# Patient Record
Sex: Male | Born: 1965 | Race: White | Hispanic: No | Marital: Married | Smoking: Never smoker
Health system: Southern US, Community
[De-identification: ages and names within clinical notes are randomized; demographics above are authoritative.]

## PROBLEM LIST (undated history)

## (undated) DIAGNOSIS — I2510 Atherosclerotic heart disease of native coronary artery without angina pectoris: Secondary | ICD-10-CM

## (undated) DIAGNOSIS — E7800 Pure hypercholesterolemia, unspecified: Secondary | ICD-10-CM

## (undated) DIAGNOSIS — I219 Acute myocardial infarction, unspecified: Secondary | ICD-10-CM

## (undated) DIAGNOSIS — K219 Gastro-esophageal reflux disease without esophagitis: Secondary | ICD-10-CM

---

## 2011-01-04 ENCOUNTER — Other Ambulatory Visit: Payer: Self-pay | Admitting: Sports Medicine

## 2011-01-04 ENCOUNTER — Ambulatory Visit
Admission: RE | Admit: 2011-01-04 | Discharge: 2011-01-04 | Disposition: A | Discharge status: Home / Self Care | Payer: Managed Care, Other (non HMO) | Source: Ambulatory Visit | Attending: Sports Medicine | Admitting: Sports Medicine

## 2011-01-04 DIAGNOSIS — M25511 Pain in right shoulder: Secondary | ICD-10-CM

## 2011-01-08 ENCOUNTER — Ambulatory Visit: Payer: Managed Care, Other (non HMO) | Admitting: Physical Therapy

## 2014-10-08 DIAGNOSIS — E7800 Pure hypercholesterolemia, unspecified: Secondary | ICD-10-CM

## 2014-10-08 HISTORY — DX: Pure hypercholesterolemia, unspecified: E78.00

## 2015-11-01 ENCOUNTER — Emergency Department (HOSPITAL_COMMUNITY): Payer: Self-pay

## 2015-11-01 ENCOUNTER — Encounter (HOSPITAL_COMMUNITY): Payer: Self-pay | Admitting: Emergency Medicine

## 2015-11-01 ENCOUNTER — Inpatient Hospital Stay (HOSPITAL_COMMUNITY)
Admission: EM | Admit: 2015-11-01 | Discharge: 2015-11-03 | DRG: 247 | Disposition: A | Discharge status: Home / Self Care | Payer: Self-pay | Attending: Internal Medicine | Admitting: Internal Medicine

## 2015-11-01 DIAGNOSIS — E785 Hyperlipidemia, unspecified: Secondary | ICD-10-CM

## 2015-11-01 DIAGNOSIS — R079 Chest pain, unspecified: Secondary | ICD-10-CM | POA: Diagnosis present

## 2015-11-01 DIAGNOSIS — I214 Non-ST elevation (NSTEMI) myocardial infarction: Principal | ICD-10-CM | POA: Diagnosis present

## 2015-11-01 DIAGNOSIS — Z7982 Long term (current) use of aspirin: Secondary | ICD-10-CM

## 2015-11-01 DIAGNOSIS — Z955 Presence of coronary angioplasty implant and graft: Secondary | ICD-10-CM

## 2015-11-01 DIAGNOSIS — I252 Old myocardial infarction: Secondary | ICD-10-CM

## 2015-11-01 DIAGNOSIS — I200 Unstable angina: Secondary | ICD-10-CM

## 2015-11-01 DIAGNOSIS — I25110 Atherosclerotic heart disease of native coronary artery with unstable angina pectoris: Secondary | ICD-10-CM | POA: Diagnosis present

## 2015-11-01 DIAGNOSIS — I219 Acute myocardial infarction, unspecified: Secondary | ICD-10-CM

## 2015-11-01 DIAGNOSIS — Z8249 Family history of ischemic heart disease and other diseases of the circulatory system: Secondary | ICD-10-CM

## 2015-11-01 HISTORY — DX: Acute myocardial infarction, unspecified: I21.9

## 2015-11-01 HISTORY — DX: Gastro-esophageal reflux disease without esophagitis: K21.9

## 2015-11-01 HISTORY — DX: Pure hypercholesterolemia, unspecified: E78.00

## 2015-11-01 HISTORY — DX: Atherosclerotic heart disease of native coronary artery without angina pectoris: I25.10

## 2015-11-01 LAB — CBC
HEMATOCRIT: 40.6 % (ref 39.0–52.0)
HEMOGLOBIN: 13.9 g/dL (ref 13.0–17.0)
MCH: 29.6 pg (ref 26.0–34.0)
MCHC: 34.2 g/dL (ref 30.0–36.0)
MCV: 86.4 fL (ref 78.0–100.0)
Platelets: 249 10*3/uL (ref 150–400)
RBC: 4.7 MIL/uL (ref 4.22–5.81)
RDW: 13.5 % (ref 11.5–15.5)
WBC: 15.1 10*3/uL — AB (ref 4.0–10.5)

## 2015-11-01 LAB — BASIC METABOLIC PANEL
ANION GAP: 10 (ref 5–15)
BUN: 13 mg/dL (ref 6–20)
CO2: 24 mmol/L (ref 22–32)
Calcium: 9.7 mg/dL (ref 8.9–10.3)
Chloride: 105 mmol/L (ref 101–111)
Creatinine, Ser: 1.41 mg/dL — ABNORMAL HIGH (ref 0.61–1.24)
GFR calc Af Amer: >60 mL/min (ref >60)
GFR, EST NON AFRICAN AMERICAN: 58 mL/min — AB (ref >60)
GLUCOSE: 124 mg/dL — AB (ref 65–99)
POTASSIUM: 4 mmol/L (ref 3.5–5.1)
Sodium: 139 mmol/L (ref 135–145)

## 2015-11-01 LAB — I-STAT TROPONIN, ED: Troponin i, poc: 0.06 ng/mL (ref 0.00–0.08)

## 2015-11-01 LAB — D-DIMER, QUANTITATIVE (NOT AT ARMC)

## 2015-11-01 MED ORDER — IOPAMIDOL (ISOVUE-370) INJECTION 76%
INTRAVENOUS | Status: AC
  Administered 2015-11-02: 100 mL
  Filled 2015-11-01: qty 100

## 2015-11-01 MED ORDER — HEPARIN BOLUS VIA INFUSION
4000.0000 [IU] | Freq: Once | INTRAVENOUS | Status: AC
  Administered 2015-11-01: 4000 [IU] via INTRAVENOUS
  Filled 2015-11-01: qty 4000

## 2015-11-01 MED ORDER — HEPARIN (PORCINE) IN NACL 100-0.45 UNIT/ML-% IJ SOLN
750.0000 [IU]/h | INTRAMUSCULAR | Status: DC
  Administered 2015-11-01: 850 [IU]/h via INTRAVENOUS
  Filled 2015-11-01: qty 250

## 2015-11-01 NOTE — ED Provider Notes (Signed)
[facility]-EMERGENCY DEPT Provider Note   CSN: [id number] Arrival date & time: 11/01/15  2131  First Provider Contact:  First MD Initiated Contact with Patient 11/01/15 2148        History   Chief Complaint Chief Complaint  Patient presents with  . Chest Pain    HPI Lance Boyer is a 50 y.o. male.  9 patient presents to the ED with central chest pain that onset after he put this patient in a strenuous mountain bike ride. He states he had no pain while he was riding his bike. He developed central chest pain lightheadedness car on the way home. Pain was in the center of his chest ray into both shoulders associated with nausea and diaphoresis and shortness of breath. He felt somewhat improved with air conditioning in his car and pulled over to the side of the road. He drove himself home but continued to have pain with numbness and tingling in both arms. His wife then called EMS. Initial EKG from EMS showed diffuse ST depressions elevation aVR. He is given aspirin and nitroglycerin with relief and is now currently pain-free. He has never had this pain before. No cardiac history. Denies any chronic medication use. Used to have high cholesterol. States he has never had pain with riding his bike before.   The history is provided by the patient and the EMS personnel. The history is limited by the condition of the patient.  Chest Pain   Associated symptoms include nausea and shortness of breath. Pertinent negatives include no abdominal pain, no dizziness, no headaches, no palpitations, no vomiting and no weakness.    Past Medical History:  Diagnosis Date  . High cholesterol 10/08/2014    Patient Active Problem List   Diagnosis Date Noted  . Unstable angina (HCC) 11/02/2015    History reviewed. No pertinent surgical history.     Home Medications    Prior to Admission medications   Not on File    Family History History reviewed. No pertinent family history.  Social History Social  History  Substance Use Topics  . Smoking status: Never Smoker  . Smokeless tobacco: Never Used  . Alcohol use No     Allergies   Review of patient's allergies indicates no known allergies.   Review of Systems Review of Systems  HENT: Negative for congestion and rhinorrhea.   Eyes: Negative for visual disturbance.  Respiratory: Positive for chest tightness and shortness of breath.   Cardiovascular: Positive for chest pain. Negative for palpitations and leg swelling.  Gastrointestinal: Positive for nausea. Negative for abdominal pain and vomiting.  Genitourinary: Negative for dysuria, hematuria and urgency.  Musculoskeletal: Negative for arthralgias and myalgias.  Neurological: Negative for dizziness, weakness, light-headedness and headaches.   A complete 10 system review of systems was obtained and all systems are negative except as noted in the HPI and PMH.    Physical Exam Updated Vital Signs BP 120/72 (BP Location: Left Arm)   Pulse 66   Temp 98.1 F (36.7 C) (Oral)   Resp 16   Ht 5\' 10"  (1.778 m)   Wt 160 lb 3.2 oz (72.7 kg)   SpO2 100%   BMI 22.99 kg/m   Physical Exam  Constitutional: He is oriented to person, place, and time. He appears well-developed and well-nourished. No distress.  HENT:  Head: Normocephalic and atraumatic.  Mouth/Throat: Oropharynx is clear and moist. No oropharyngeal exudate.  Eyes: Conjunctivae and EOM are normal. Pupils are equal, round, and reactive to light.  Neck: Normal range of motion. Neck supple.  No meningismus.  Cardiovascular: Normal rate, regular rhythm, normal heart sounds and intact distal pulses.   No murmur heard. Pulmonary/Chest: Effort normal and breath sounds normal. No respiratory distress.  Abdominal: Soft. There is no tenderness. There is no rebound and no guarding.  Musculoskeletal: Normal range of motion. He exhibits no edema or tenderness.  Neurological: He is alert and oriented to person, place, and time. No  cranial nerve deficit. He exhibits normal muscle tone. Coordination normal.  No ataxia on finger to nose bilaterally. No pronator drift. 5/5 strength throughout. CN 2-12 intact.Equal grip strength. Sensation intact.   Skin: Skin is warm.  Psychiatric: He has a normal mood and affect. His behavior is normal.  Nursing note and vitals reviewed.    ED Treatments / Results  Labs (all labs ordered are listed, but only abnormal results are displayed) Labs Reviewed  BASIC METABOLIC PANEL - Abnormal; Notable for the following:       Result Value   Glucose, Bld 124 (*)    Creatinine, Ser 1.41 (*)    GFR calc non Af Amer 58 (*)    All other components within normal limits  CBC - Abnormal; Notable for the following:    WBC 15.1 (*)    All other components within normal limits  D-DIMER, QUANTITATIVE (NOT AT [facility])  HEPARIN LEVEL (UNFRACTIONATED)  HEPARIN LEVEL (UNFRACTIONATED)  CBC  MAGNESIUM  TROPONIN I  TROPONIN I  TROPONIN I  BRAIN NATRIURETIC PEPTIDE  LIPID PANEL  HEMOGLOBIN A1C  I-STAT TROPOININ, ED    EKG  EKG Interpretation  Date/Time:  Tuesday November 01 2015 23:53:47 EDT Ventricular Rate:  65 PR Interval:    QRS Duration: 98 QT Interval:  437 QTC Calculation: 455 R Axis:   89 Text Interpretation:  Sinus rhythm Left ventricular hypertrophy No significant change was found Confirmed by Manus Gunning  MD, Tniyah Nakagawa [phone]) on 11/01/2015 11:56:54 PM       Radiology Dg Chest 2 View  Result Date: 11/01/2015 CLINICAL DATA:  Chest pain and shortness of breath abnormality light ride this evening. EXAM: CHEST  2 VIEW COMPARISON:  None. FINDINGS: Normal cardiac silhouette and mediastinal contours. There is an ill-defined slightly wedge-shaped opacity within the peripheral aspect of the left mid lung. No pleural effusion or pneumothorax. No evidence of edema. No acute osseus abnormalities. IMPRESSION: Ill-defined wedge-shaped opacity with the peripheral aspect the left mid lung may represent  an area of early infection though note, pulmonary infarction in the setting of pulmonary embolism could have a similar appearance. Clinical correlation is advised. Further evaluation with chest CTA could be obtained as clinically indicated. Electronically Signed   By: Simonne Come M.D.   On: 11/01/2015 22:17  Ct Angio Chest Pe W/cm &/or Wo Cm  Result Date: 11/02/2015 CLINICAL DATA:  Abnormal chest chest radiograph. Acute onset of shortness of breath while mountain biking earlier today. EXAM: CT ANGIOGRAPHY CHEST WITH CONTRAST TECHNIQUE: Multidetector CT imaging of the chest was performed using the standard protocol during bolus administration of intravenous contrast. Multiplanar CT image reconstructions and MIPs were obtained to evaluate the vascular anatomy. CONTRAST:  100 cc Isovue 370 COMPARISON:  Chest radiograph-earlier same day FINDINGS: Vascular Findings: There is adequate opacification of the pulmonary arterial system with the main pulmonary artery measuring 422 Hounsfield units. There are no discrete filling defects within the pulmonary arterial tree to the level of the bilateral subsegmental pulmonary arteries. Evaluation of the distal subsegmental pulmonary arteries  is degraded secondary to patient respiratory artifact. Normal caliber of the main pulmonary artery. Normal heart size.  No pericardial effusion. Normal caliber of the thoracic aorta. No definite thoracic aortic dissection on this nongated examination. Bovine configuration of the aortic arch. The branch vessels of the aortic arch appear widely patent throughout their imaged course. 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 Review of the MIP images confirms the above findings. ---------------------------------------------------------------------------------- Nonvascular Findings: Mediastinum/Lymph Nodes: No bulky mediastinal, hilar axillary lymphadenopathy. Lungs/Pleura: Evaluation the pulmonary parenchyma is degraded secondary to patient  respiratory artifact. There is no CT correlate for questioned wedge-shaped opacity with the peripheral aspect of the left upper/mid lung. Minimal dependent subpleural ground-glass atelectasis. No discrete focal airspace opacities. No pleural effusion or pneumothorax. The central pulmonary airways appear widely patent. No discrete pulmonary nodules. Upper abdomen: Early arterial phase evaluation of the upper abdomen is normal. Musculoskeletal: No acute or aggressive osseous abnormalities. Regional soft tissues appear normal. Normal appearance of the thyroid gland. IMPRESSION: No acute cardiopulmonary disease. Specifically, no evidence of pulmonary embolism. The questioned wedge-shaped opacity within the peripheral aspect of the left upper/mid lung is without CT correlate and favored to be artifactual due to the confluence of overlying osseous and soft tissue structures. Electronically Signed   By: Simonne Come M.D.   On: 11/02/2015 00:28   Procedures Procedures (including critical care time)  Medications Ordered in ED Medications  heparin ADULT infusion 100 units/mL (25000 units/226mL sodium chloride 0.45%) (850 Units/hr Intravenous Transfusing/Transfer 11/02/15 0114)  aspirin EC tablet 81 mg (not administered)  nitroGLYCERIN (NITROSTAT) SL tablet 0.4 mg (not administered)  acetaminophen (TYLENOL) tablet 650 mg (not administered)  ondansetron (ZOFRAN) injection 4 mg (not administered)  clopidogrel (PLAVIX) tablet 300 mg (not administered)  metoprolol tartrate (LOPRESSOR) tablet 12.5 mg (not administered)  atorvastatin (LIPITOR) tablet 40 mg (not administered)  heparin bolus via infusion 4,000 Units (4,000 Units Intravenous Bolus from Bag 11/01/15 2230)  iopamidol (ISOVUE-370) 76 % injection (100 mLs  Contrast Given 11/02/15 0004)     Initial Impression / Assessment and Plan / ED Course  I have reviewed the triage vital signs and the nursing notes.  Pertinent labs & imaging results that were  available during my care of the patient were reviewed by me and considered in my medical decision making (see chart for details).  Clinical Course   Presentation highly concerning for angina. Patient chest pain-free now after aspirin or nitroglycerin. Initial EMS EKG showed ST depressions inferiorly with elevation aVR with dewinter T waves. Current EKG shows J-point elevation in inferior laterally with no comparison.  Discussed with Dr. Tresa Endo. He is pain-free at this time. EKG does not meet ST elevation MI criteria. Advises to start heparin and have cardiology fellow consult given EKG abnormalities for EMS.  D-dimer negative but wedge opacity on CXR.  No tachycardia or hypoxia.  Troponin 0.06.  No further chest pain in the ED. D/w Dr. Tarri Glenn who will admit patient for unstable angina, does request CTPE given CXR findings.   CRITICAL CARE Performed by: Glynn Octave Total critical care time: 40 minutes Critical care time was exclusive of separately billable procedures and treating other patients. Critical care was necessary to treat or prevent imminent or life-threatening deterioration. Critical care was time spent personally by me on the following activities: development of treatment plan with patient and/or surrogate as well as nursing, discussions with consultants, evaluation of patient's response to treatment, examination of patient, obtaining history from patient or surrogate, ordering  and performing treatments and interventions, ordering and review of laboratory studies, ordering and review of radiographic studies, pulse oximetry and re-evaluation of patient's condition.    Final Clinical Impressions(s) / ED Diagnoses   Final diagnoses:  Unstable angina (HCC)    New Prescriptions There are no discharge medications for this patient.    Glynn Octave, MD 11/02/15 [phone]

## 2015-11-01 NOTE — ED Notes (Signed)
Patient transported to CT 

## 2015-11-01 NOTE — ED Notes (Signed)
Patient transported to X-ray 

## 2015-11-01 NOTE — ED Triage Notes (Signed)
Pt arrived to ED via EMS. C/o central chest pain. Pt participated in exercise via bicycle. While driving pt felt pressure in chest, became diaphoretic and experienced SOB. Pain subside and returned once he arrived home. EMS arrived and administered 324 mg aspirin and 1 nitro. Pain initially reported at 6. EKG showed arrythmias with no elevations. No nausea or vomiting reported. No current chest pain.

## 2015-11-01 NOTE — ED Notes (Signed)
Pt back from x-ray in no apparent distress  

## 2015-11-01 NOTE — Progress Notes (Signed)
ANTICOAGULATION CONSULT NOTE - Initial Consult  Pharmacy Consult for heparin Indication: chest pain/ACS  Allergies not on file  Patient Measurements: Height: 5\' 10"  (177.8 cm) Weight: 160 lb (72.6 kg) IBW/kg (Calculated) : 73 Heparin Dosing Weight: 72 kg  Vital Signs: Temp: 97.7 F (36.5 C) (07/25 2134) Temp Source: Oral (07/25 2134) BP: 115/63 (07/25 2139) Pulse Rate: 64 (07/25 2139)  Labs:  Recent Labs  11/01/15 2152  HGB 13.9  HCT 40.6  PLT 249    CrCl cannot be calculated (No order found.).  Assessment: 50 yo m presenting with central CP - was biking and felt pressure, became diaphoretic and SOB - no meds PTA  PMH: HLD   AC: none pta - IV hep for r/o ACS  Renal: labs ip  Heme: labs ip  Goal of Therapy:  Heparin level 0.3-0.7 units/ml Monitor platelets by anticoagulation protocol: Yes   Plan:  Heparin bolus 4000 units Heparin infusion 850 units/hr Initial HL at 0400 Daily HL, CBC Monitor s/sx of bleeding  Isaac Bliss, PharmD, BCPS, [facility] Clinical Pharmacist Pager [phone] 11/01/2015 10:04 PM

## 2015-11-02 ENCOUNTER — Inpatient Hospital Stay (HOSPITAL_COMMUNITY): Payer: Self-pay

## 2015-11-02 ENCOUNTER — Encounter (HOSPITAL_COMMUNITY): Payer: Self-pay | Admitting: Radiology

## 2015-11-02 ENCOUNTER — Encounter (HOSPITAL_COMMUNITY)
Admission: EM | Disposition: A | Discharge status: Home / Self Care | Payer: Self-pay | Source: Home / Self Care | Attending: Internal Medicine

## 2015-11-02 DIAGNOSIS — R079 Chest pain, unspecified: Secondary | ICD-10-CM | POA: Diagnosis present

## 2015-11-02 DIAGNOSIS — I200 Unstable angina: Secondary | ICD-10-CM

## 2015-11-02 DIAGNOSIS — I2510 Atherosclerotic heart disease of native coronary artery without angina pectoris: Secondary | ICD-10-CM

## 2015-11-02 DIAGNOSIS — I252 Old myocardial infarction: Secondary | ICD-10-CM

## 2015-11-02 HISTORY — PX: CARDIAC CATHETERIZATION: SHX172

## 2015-11-02 LAB — CBC
HCT: 42 % (ref 39.0–52.0)
HEMATOCRIT: 38.9 % — AB (ref 39.0–52.0)
HEMOGLOBIN: 13.8 g/dL (ref 13.0–17.0)
Hemoglobin: 12.9 g/dL — ABNORMAL LOW (ref 13.0–17.0)
MCH: 28.4 pg (ref 26.0–34.0)
MCH: 28.5 pg (ref 26.0–34.0)
MCHC: 32.9 g/dL (ref 30.0–36.0)
MCHC: 33.2 g/dL (ref 30.0–36.0)
MCV: 86.4 fL (ref 78.0–100.0)
MCV: 86.1 fL (ref 78.0–100.0)
PLATELETS: 215 10*3/uL (ref 150–400)
Platelets: 262 10*3/uL (ref 150–400)
RBC: 4.86 MIL/uL (ref 4.22–5.81)
RBC: 4.52 MIL/uL (ref 4.22–5.81)
RDW: 13.6 % (ref 11.5–15.5)
RDW: 13.7 % (ref 11.5–15.5)
WBC: 12.4 10*3/uL — ABNORMAL HIGH (ref 4.0–10.5)
WBC: 8 10*3/uL (ref 4.0–10.5)

## 2015-11-02 LAB — ECHOCARDIOGRAM COMPLETE
HEIGHTINCHES: 70 in
Weight: 2563.2 oz

## 2015-11-02 LAB — LIPID PANEL
CHOLESTEROL: 256 mg/dL — AB (ref 0–200)
HDL: 74 mg/dL (ref >40)
LDL Cholesterol: 169 mg/dL — ABNORMAL HIGH (ref 0–99)
TRIGLYCERIDES: 64 mg/dL (ref <150)
Total CHOL/HDL Ratio: 3.5 RATIO
VLDL: 13 mg/dL (ref 0–40)

## 2015-11-02 LAB — GLUCOSE, CAPILLARY: GLUCOSE-CAPILLARY: 97 mg/dL (ref 65–99)

## 2015-11-02 LAB — HEPARIN LEVEL (UNFRACTIONATED)
HEPARIN UNFRACTIONATED: 0.45 [IU]/mL (ref 0.30–0.70)
HEPARIN UNFRACTIONATED: 0.4 [IU]/mL (ref 0.30–0.70)
Heparin Unfractionated: 0.79 IU/mL — ABNORMAL HIGH (ref 0.30–0.70)

## 2015-11-02 LAB — POCT ACTIVATED CLOTTING TIME: ACTIVATED CLOTTING TIME: 290 s

## 2015-11-02 LAB — CREATININE, SERUM
CREATININE: 1.03 mg/dL (ref 0.61–1.24)
GFR calc Af Amer: >60 mL/min (ref >60)

## 2015-11-02 LAB — TROPONIN I
TROPONIN I: 0.44 ng/mL — AB (ref <0.03)
TROPONIN I: 0.7 ng/mL — AB (ref <0.03)
TROPONIN I: 0.9 ng/mL — AB (ref <0.03)

## 2015-11-02 LAB — MAGNESIUM: Magnesium: 2.5 mg/dL — ABNORMAL HIGH (ref 1.7–2.4)

## 2015-11-02 LAB — BRAIN NATRIURETIC PEPTIDE: B NATRIURETIC PEPTIDE 5: 32.8 pg/mL (ref 0.0–100.0)

## 2015-11-02 IMAGING — CT CT ANGIO CHEST
1 of 9 series · 13 of 36 positions shown · IV contrast (Iohexol (Omnipaque 350))
Comparison: Chest radiograph-earlier same day

CLINICAL DATA: Abnormal chest chest radiograph. Acute onset of
shortness of breath while mountain biking earlier today.

EXAM:
CT ANGIOGRAPHY CHEST WITH CONTRAST
TECHNIQUE: Multidetector CT imaging of the chest was performed using the
standard protocol during bolus administration of intravenous
contrast. Multiplanar CT image reconstructions and MIPs were
obtained to evaluate the vascular anatomy.
CONTRAST:  100 cc Isovue 370

[Series 406: thins pacs · axial · 0.65mm/px · z∈[+75,+333]mm · 13 of 302 slices shown]
[im 22/302  lung]
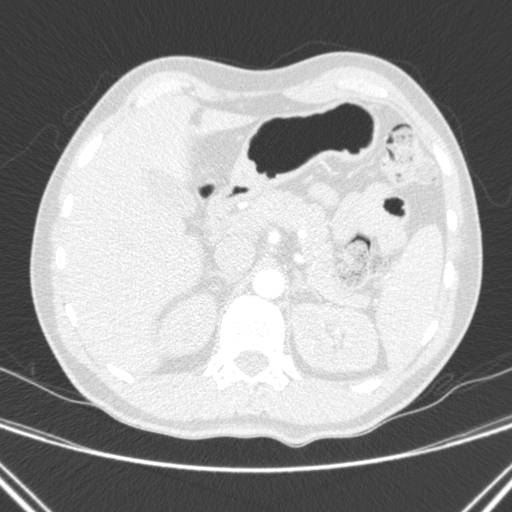
[im 44/302  mediastinal]
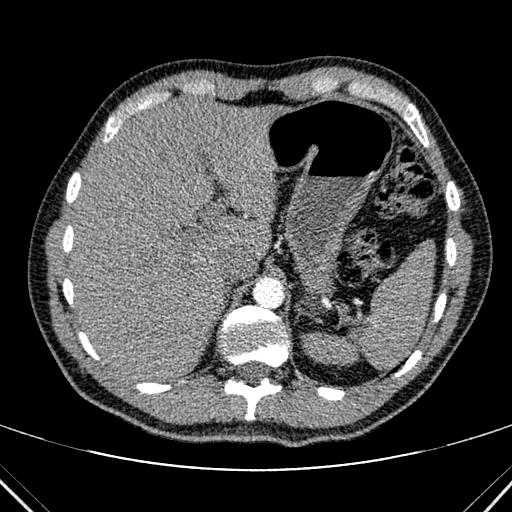
[im 65/302  lung]
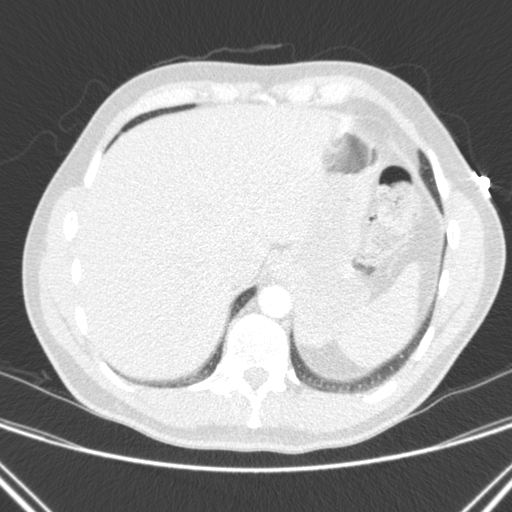
[im 87/302  mediastinal]
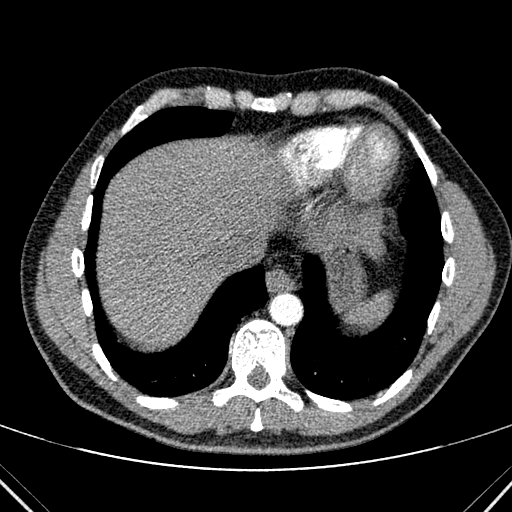
[im 108/302  lung]
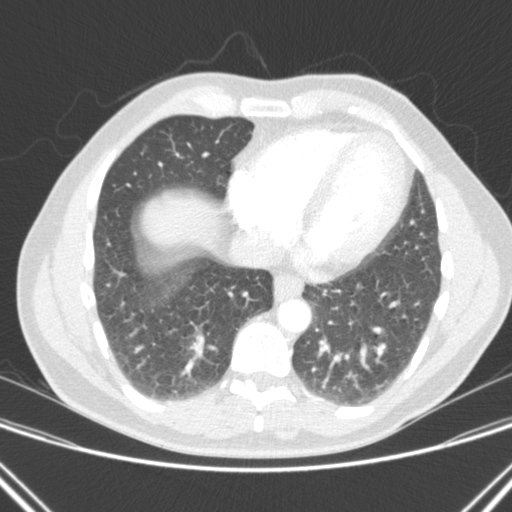
[im 130/302  mediastinal]
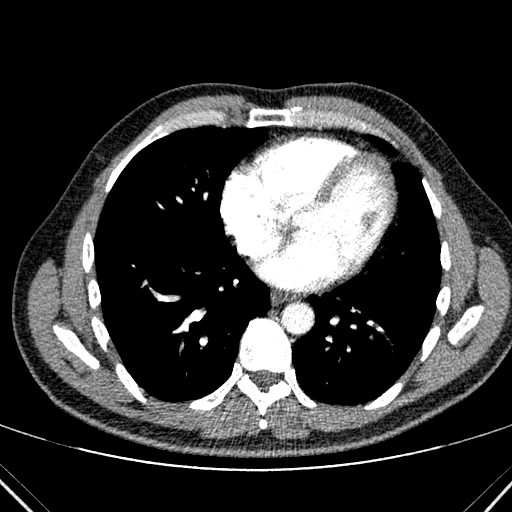
[im 151/302  lung]
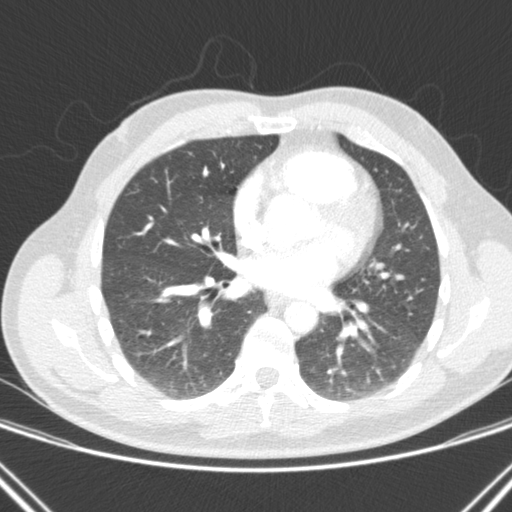
[im 173/302  mediastinal]
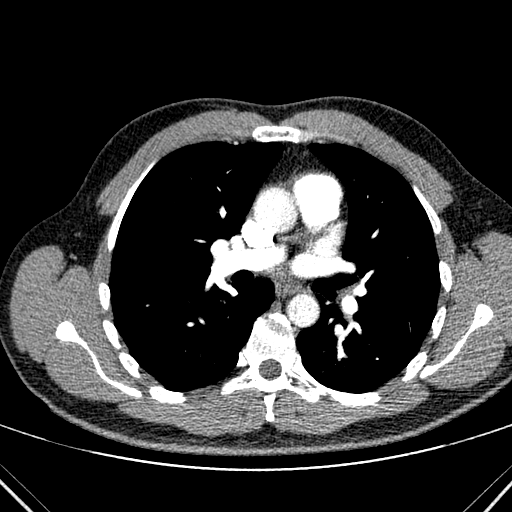
[im 194/302  lung]
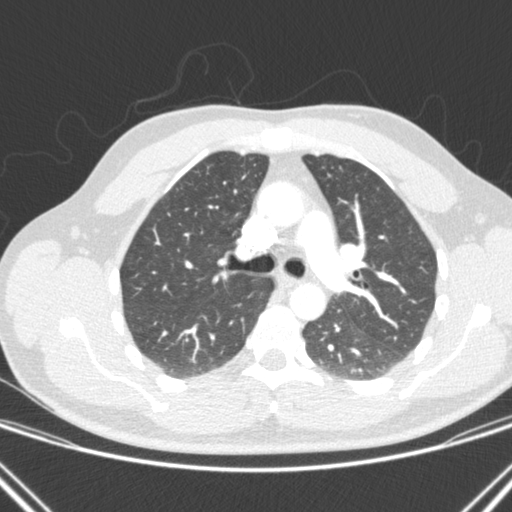
[im 216/302  mediastinal]
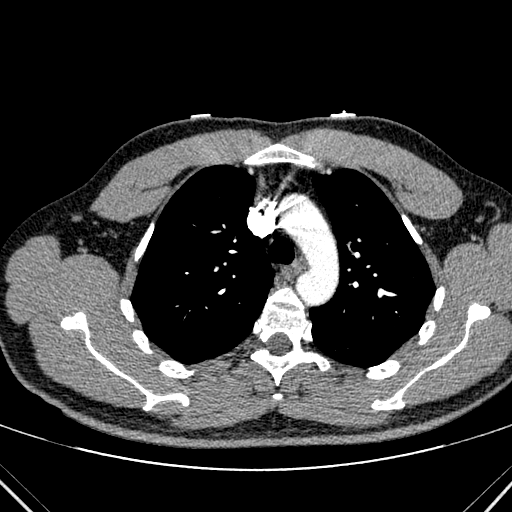
[im 237/302  lung]
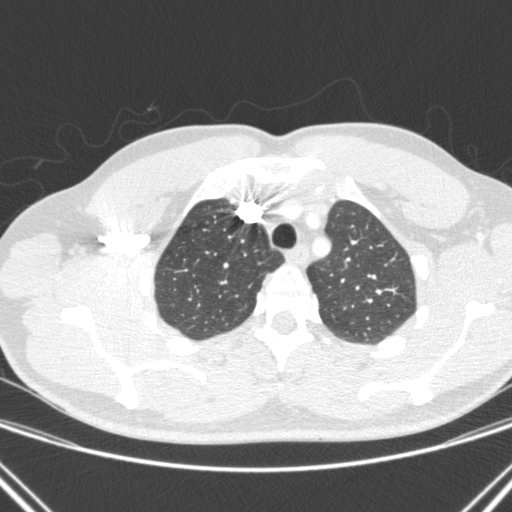
[im 259/302  mediastinal]
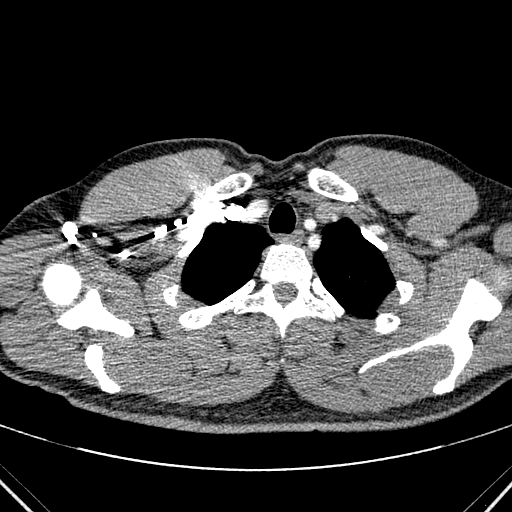
[im 280/302  lung]
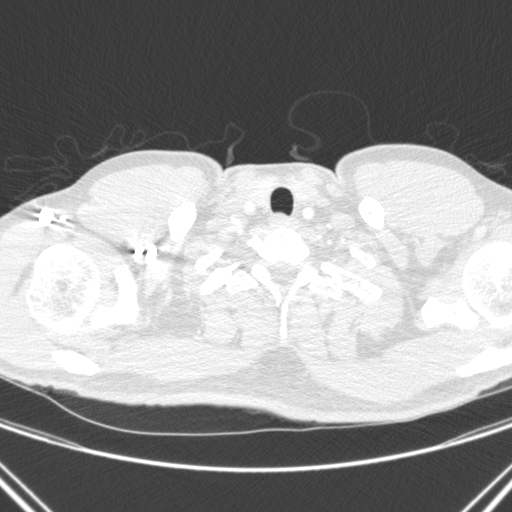

[13 of 36 positions shown; findings below may reference images not displayed]

FINDINGS: Vascular Findings:

There is adequate opacification of the pulmonary arterial system
with the main pulmonary artery measuring 422 Hounsfield units. There
are no discrete filling defects within the pulmonary arterial tree
to the level of the bilateral subsegmental pulmonary arteries.
Evaluation of the distal subsegmental pulmonary arteries is degraded
secondary to patient respiratory artifact. Normal caliber of the
main pulmonary artery.

Normal heart size.  No pericardial effusion.

Normal caliber of the thoracic aorta. No definite thoracic aortic
dissection on this nongated examination. Bovine configuration of the
aortic arch. The branch vessels of the aortic arch appear widely
patent throughout their imaged course. Note is made of a small
ductus diverticulum.

Review of the MIP images confirms the above findings.

----------------------------------------------------------------------------------

Nonvascular Findings:

Mediastinum/Lymph Nodes: No bulky mediastinal, hilar axillary
lymphadenopathy.

Lungs/Pleura: Evaluation the pulmonary parenchyma is degraded
secondary to patient respiratory artifact.

There is no CT correlate for questioned wedge-shaped opacity with
the peripheral aspect of the left upper/mid lung.

Minimal dependent subpleural ground-glass atelectasis. No discrete
focal airspace opacities. No pleural effusion or pneumothorax. The
central pulmonary airways appear widely patent. No discrete
pulmonary nodules.

Upper abdomen: Early arterial phase evaluation of the upper abdomen
is normal.

Musculoskeletal: No acute or aggressive osseous abnormalities.

Regional soft tissues appear normal. Normal appearance of the
thyroid gland.
IMPRESSION: No acute cardiopulmonary disease. Specifically, no evidence of
pulmonary embolism. The questioned wedge-shaped opacity within the
peripheral aspect of the left upper/mid lung is without CT correlate
and favored to be artifactual due to the confluence of overlying
osseous and soft tissue structures.

## 2015-11-02 SURGERY — LEFT HEART CATH AND CORONARY ANGIOGRAPHY

## 2015-11-02 MED ORDER — MIDAZOLAM HCL 2 MG/2ML IJ SOLN
INTRAMUSCULAR | Status: DC | PRN
  Administered 2015-11-02 (×2): 1 mg via INTRAVENOUS
  Administered 2015-11-02: 2 mg via INTRAVENOUS

## 2015-11-02 MED ORDER — ASPIRIN EC 81 MG PO TBEC
81.0000 mg | DELAYED_RELEASE_TABLET | Freq: Every day | ORAL | Status: DC
  Administered 2015-11-03: 81 mg via ORAL
  Filled 2015-11-02: qty 1

## 2015-11-02 MED ORDER — ASPIRIN 81 MG PO CHEW
81.0000 mg | CHEWABLE_TABLET | ORAL | Status: AC
  Administered 2015-11-02: 81 mg via ORAL
  Filled 2015-11-02: qty 1

## 2015-11-02 MED ORDER — TICAGRELOR 90 MG PO TABS: 90.0000 mg | ORAL_TABLET | Freq: Two times a day (BID) | ORAL | Status: DC

## 2015-11-02 MED ORDER — ATORVASTATIN CALCIUM 40 MG PO TABS
40.0000 mg | ORAL_TABLET | Freq: Every day | ORAL | Status: DC
  Administered 2015-11-02: 40 mg via ORAL
  Filled 2015-11-02: qty 1

## 2015-11-02 MED ORDER — SODIUM CHLORIDE 0.9 % IV SOLN: 250.0000 mL | INTRAVENOUS | Status: DC | PRN

## 2015-11-02 MED ORDER — TICAGRELOR 90 MG PO TABS
ORAL_TABLET | ORAL | Status: DC | PRN
  Administered 2015-11-02: 180 mg via ORAL

## 2015-11-02 MED ORDER — SODIUM CHLORIDE 0.9% FLUSH: 3.0000 mL | Freq: Two times a day (BID) | INTRAVENOUS | Status: DC

## 2015-11-02 MED ORDER — HEPARIN (PORCINE) IN NACL 2-0.9 UNIT/ML-% IJ SOLN
INTRAMUSCULAR | Status: AC
  Filled 2015-11-02: qty 1000

## 2015-11-02 MED ORDER — HEART ATTACK BOUNCING BOOK
Freq: Once | Status: AC
  Administered 2015-11-02: 22:00:00
  Filled 2015-11-02: qty 1

## 2015-11-02 MED ORDER — TICAGRELOR 90 MG PO TABS
ORAL_TABLET | ORAL | Status: AC
  Filled 2015-11-02: qty 2

## 2015-11-02 MED ORDER — SODIUM CHLORIDE 0.9 % WEIGHT BASED INFUSION
3.0000 mL/kg/h | INTRAVENOUS | Status: DC
Start: 2015-11-02 — End: 2015-11-02

## 2015-11-02 MED ORDER — LIDOCAINE HCL (PF) 1 % IJ SOLN
INTRAMUSCULAR | Status: AC
  Filled 2015-11-02: qty 30

## 2015-11-02 MED ORDER — LIDOCAINE HCL (PF) 1 % IJ SOLN
INTRAMUSCULAR | Status: DC | PRN
  Administered 2015-11-02: 3 mL via INTRADERMAL
  Administered 2015-11-02: 20 mL via INTRADERMAL

## 2015-11-02 MED ORDER — HEPARIN SODIUM (PORCINE) 1000 UNIT/ML IJ SOLN
INTRAMUSCULAR | Status: AC
  Filled 2015-11-02: qty 1

## 2015-11-02 MED ORDER — CLOPIDOGREL BISULFATE 75 MG PO TABS: 75.0000 mg | ORAL_TABLET | Freq: Every day | ORAL | Status: DC

## 2015-11-02 MED ORDER — METOPROLOL TARTRATE 12.5 MG HALF TABLET
12.5000 mg | ORAL_TABLET | Freq: Two times a day (BID) | ORAL | Status: DC
Start: 2015-11-02 — End: 2015-11-03
  Administered 2015-11-02 – 2015-11-03 (×3): 12.5 mg via ORAL
  Filled 2015-11-02 (×3): qty 1

## 2015-11-02 MED ORDER — CLOPIDOGREL BISULFATE 75 MG PO TABS
300.0000 mg | ORAL_TABLET | Freq: Once | ORAL | Status: AC
  Administered 2015-11-02: 300 mg via ORAL
  Filled 2015-11-02: qty 4

## 2015-11-02 MED ORDER — ENOXAPARIN SODIUM 40 MG/0.4ML ~~LOC~~ SOLN
40.0000 mg | SUBCUTANEOUS | Status: DC
  Administered 2015-11-03: 40 mg via SUBCUTANEOUS
  Filled 2015-11-02 (×2): qty 0.4

## 2015-11-02 MED ORDER — IOPAMIDOL (ISOVUE-370) INJECTION 76%
INTRAVENOUS | Status: AC
  Filled 2015-11-02: qty 50

## 2015-11-02 MED ORDER — ONDANSETRON HCL 4 MG/2ML IJ SOLN: 4.0000 mg | Freq: Four times a day (QID) | INTRAMUSCULAR | Status: DC | PRN

## 2015-11-02 MED ORDER — IOPAMIDOL (ISOVUE-370) INJECTION 76%
INTRAVENOUS | Status: AC
  Filled 2015-11-02: qty 100

## 2015-11-02 MED ORDER — VERAPAMIL HCL 2.5 MG/ML IV SOLN
INTRAVENOUS | Status: AC
  Filled 2015-11-02: qty 2

## 2015-11-02 MED ORDER — IOPAMIDOL (ISOVUE-370) INJECTION 76%
INTRAVENOUS | Status: DC | PRN
  Administered 2015-11-02: 130 mL via INTRA_ARTERIAL

## 2015-11-02 MED ORDER — BIVALIRUDIN BOLUS VIA INFUSION - CUPID
INTRAVENOUS | Status: DC | PRN
  Administered 2015-11-02: 54.525 mg via INTRAVENOUS

## 2015-11-02 MED ORDER — ASPIRIN 81 MG PO CHEW: 81.0000 mg | CHEWABLE_TABLET | Freq: Every day | ORAL | Status: DC

## 2015-11-02 MED ORDER — VERAPAMIL HCL 2.5 MG/ML IV SOLN
INTRAVENOUS | Status: DC | PRN
  Administered 2015-11-02: 10 mL via INTRA_ARTERIAL

## 2015-11-02 MED ORDER — FENTANYL CITRATE (PF) 100 MCG/2ML IJ SOLN
INTRAMUSCULAR | Status: DC | PRN
  Administered 2015-11-02 (×3): 25 ug via INTRAVENOUS

## 2015-11-02 MED ORDER — MIDAZOLAM HCL 2 MG/2ML IJ SOLN
INTRAMUSCULAR | Status: AC
  Filled 2015-11-02: qty 2

## 2015-11-02 MED ORDER — SODIUM CHLORIDE 0.9% FLUSH: 3.0000 mL | INTRAVENOUS | Status: DC | PRN

## 2015-11-02 MED ORDER — FENTANYL CITRATE (PF) 100 MCG/2ML IJ SOLN
INTRAMUSCULAR | Status: AC
  Filled 2015-11-02: qty 2

## 2015-11-02 MED ORDER — SODIUM CHLORIDE 0.9 % IV SOLN
INTRAVENOUS | Status: DC | PRN
  Administered 2015-11-02: 1.75 mg/kg/h via INTRAVENOUS

## 2015-11-02 MED ORDER — BIVALIRUDIN 250 MG IV SOLR
INTRAVENOUS | Status: AC
  Filled 2015-11-02: qty 250

## 2015-11-02 MED ORDER — ACETAMINOPHEN 325 MG PO TABS: 650.0000 mg | ORAL_TABLET | ORAL | Status: DC | PRN

## 2015-11-02 MED ORDER — NITROGLYCERIN 1 MG/10 ML FOR IR/CATH LAB
INTRA_ARTERIAL | Status: AC
  Filled 2015-11-02: qty 10

## 2015-11-02 MED ORDER — NITROGLYCERIN 0.4 MG SL SUBL: 0.4000 mg | SUBLINGUAL_TABLET | SUBLINGUAL | Status: DC | PRN

## 2015-11-02 MED ORDER — SODIUM CHLORIDE 0.9% FLUSH
3.0000 mL | Freq: Two times a day (BID) | INTRAVENOUS | Status: DC
  Administered 2015-11-02: 3 mL via INTRAVENOUS

## 2015-11-02 MED ORDER — ANGIOPLASTY BOOK
Freq: Once | Status: AC
  Administered 2015-11-02: 21:00:00
  Filled 2015-11-02: qty 1

## 2015-11-02 MED ORDER — SODIUM CHLORIDE 0.9 % IV SOLN
INTRAVENOUS | Status: AC
  Administered 2015-11-02 (×2): via INTRAVENOUS

## 2015-11-02 MED ORDER — HEPARIN (PORCINE) IN NACL 2-0.9 UNIT/ML-% IJ SOLN
INTRAMUSCULAR | Status: DC | PRN
  Administered 2015-11-02: 1000 mL

## 2015-11-02 MED ORDER — SODIUM CHLORIDE 0.9 % WEIGHT BASED INFUSION: 1.0000 mL/kg/h | INTRAVENOUS | Status: DC

## 2015-11-02 MED ORDER — NITROGLYCERIN 1 MG/10 ML FOR IR/CATH LAB
INTRA_ARTERIAL | Status: DC | PRN
  Administered 2015-11-02: 200 ug via INTRA_ARTERIAL

## 2015-11-02 MED ORDER — TICAGRELOR 90 MG PO TABS
90.0000 mg | ORAL_TABLET | Freq: Two times a day (BID) | ORAL | Status: DC
  Administered 2015-11-02: 90 mg via ORAL
  Filled 2015-11-02 (×2): qty 1

## 2015-11-02 SURGICAL SUPPLY — 24 items
BALLN ~~LOC~~ EUPHORA RX 3.5X20 (BALLOONS) ×3
BALLN ~~LOC~~ EUPHORA RX 3.75X20 (BALLOONS) ×3
BALLOON ~~LOC~~ EUPHORA RX 3.5X20 (BALLOONS) ×1 IMPLANT
BALLOON ~~LOC~~ EUPHORA RX 3.75X20 (BALLOONS) ×1 IMPLANT
CATH INFINITI 5 FR JL3.5 (CATHETERS) ×3 IMPLANT
CATH INFINITI 5FR ANG PIGTAIL (CATHETERS) IMPLANT
CATH INFINITI JR4 5F (CATHETERS) ×3 IMPLANT
DEVICE RAD COMP TR BAND LRG (VASCULAR PRODUCTS) ×3 IMPLANT
GLIDESHEATH SLEND SS 6F .021 (SHEATH) ×3 IMPLANT
GUIDE CATH RUNWAY 6FR CLS3 (CATHETERS) ×3 IMPLANT
GUIDE CATH RUNWAY 6FR FR4 (CATHETERS) ×3 IMPLANT
KIT ENCORE 26 ADVANTAGE (KITS) ×3 IMPLANT
KIT HEART LEFT (KITS) ×3 IMPLANT
PACK CARDIAC CATHETERIZATION (CUSTOM PROCEDURE TRAY) ×3 IMPLANT
SHEATH PINNACLE 5F 10CM (SHEATH) ×3 IMPLANT
SHEATH PINNACLE 6F 10CM (SHEATH) ×3 IMPLANT
STENT SYNERGY DES 3.5X24 (Permanent Stent) ×3 IMPLANT
STENT SYNERGY DES 3X28 (Permanent Stent) ×3 IMPLANT
TRANSDUCER W/STOPCOCK (MISCELLANEOUS) ×3 IMPLANT
TUBING CIL FLEX 10 FLL-RA (TUBING) ×3 IMPLANT
VALVE GUARDIAN II ~~LOC~~ HEMO (MISCELLANEOUS) ×3 IMPLANT
WIRE ASAHI PROWATER 180CM (WIRE) ×3 IMPLANT
WIRE HI TORQ VERSACORE-J 145CM (WIRE) ×3 IMPLANT
WIRE SAFE-T 1.5MM-J .035X260CM (WIRE) ×3 IMPLANT

## 2015-11-02 NOTE — Progress Notes (Signed)
  Echocardiogram 2D Echocardiogram has been performed.  Lance Boyer 11/02/2015, 3:53 PM

## 2015-11-02 NOTE — ED Notes (Signed)
Pt back from CT in no apparent distress  

## 2015-11-02 NOTE — Progress Notes (Signed)
CRITICAL VALUE ALERT  Critical value received: Troponin 0.44  Date of notification:  11/02/2015  Time of notification:  0234  Critical value read back:Yes.    Nurse who received alert:  Langley Gauss  MD notified (1st page):  Azeem  Time of first page:  0236  MD notified (2nd page):Azeem  Time of second page:0254  Responding MD:  Azeem  Time MD responded:  0256  No new orders

## 2015-11-02 NOTE — Progress Notes (Addendum)
Site area: right groin  Site Prior to Removal:  Level 0  Pressure Applied For 20 MINUTES    Minutes Beginning at 1540  Manual:   Yes.    Patient Status During Pull:  Stable   Post Pull Groin Site:  Level 0  Post Pull Instructions Given:  Yes.    Post Pull Pulses Present:  Yes.    Dressing Applied:  Yes.    Comments:  Rechecked site with no change in assessment noted

## 2015-11-02 NOTE — H&P (View-Only) (Signed)
Patient Name: Lance Boyer Date of Encounter: 11/02/2015  Hospital Problem List     Active Problems:   Unstable angina (HCC)    Subjective   No angina at this time. Resting comfortably.   Inpatient Medications    . aspirin EC  81 mg Oral Daily  . atorvastatin  40 mg Oral q1800  . metoprolol tartrate  12.5 mg Oral BID    Vital Signs    Vitals:   11/02/15 0100 11/02/15 0115 11/02/15 0132 11/02/15 0440  BP: 125/81  120/72 115/64  Pulse: 65  66 63  Resp: Temp:  98.3 F (36.8 C) 98.1 F (36.7 C) 97.9 F (36.6 C)  TempSrc:  Oral Oral Oral  SpO2: 100%  100% 99%  Weight:   160 lb 3.2 oz (72.7 kg)   Height:    (1.778 m)    No intake or output data in the 24 hours ending 11/02/15 0709 Filed Weights   11/01/15 2145 11/02/15 0132  Weight: 160 lb (72.6 kg) 160 lb 3.2 oz (72.7 kg)    Physical Exam    General: Pleasant, NAD. Neuro: Alert and oriented X 3. Moves all extremities spontaneously. Psych: Normal affect. HEENT:  Normal  Neck: Supple without bruits or JVD. Lungs:  Resp regular and unlabored, CTA. Heart: RRR no s3, s4, or murmurs. Abdomen: Soft, non-tender, non-distended, BS + x 4.  Extremities: No clubbing, cyanosis or edema. DP/PT/Radials 2+ and equal bilaterally.  Labs    CBC  Recent Labs  11/01/15 2152 11/02/15 0145  WBC 15.1* 12.4*  HGB 13.9 13.8  HCT 40.6 42.0  MCV 86.4 86.4  PLT 249 262   Basic Metabolic Panel  Recent Labs  11/01/15 2152 11/02/15 0145  NA 139  --   K 4.0  --   CL 105  --   CO2 24  --   GLUCOSE 124*  --   BUN 13  --   CREATININE 1.41*  --   CALCIUM 9.7  --   MG  --  2.5*   Liver Function Tests No results for input(s): AST, ALT, ALKPHOS, BILITOT, PROT, ALBUMIN in the last 72 hours. No results for input(s): LIPASE, AMYLASE in the last 72 hours. Cardiac Enzymes  Recent Labs  11/02/15 0145  TROPONINI 0.44*   BNP Invalid input(s): POCBNP D-Dimer  Recent Labs  11/01/15 2152  DDIMER <0.27     Hemoglobin A1C No results for input(s): HGBA1C in the last 72 hours. Fasting Lipid Panel  Recent Labs  11/02/15 0429  CHOL 256*  HDL 74  LDLCALC 169*  TRIG 64  CHOLHDL 3.5   Thyroid Function Tests No results for input(s): TSH, T4TOTAL, T3FREE, THYROIDAB in the last 72 hours.  Invalid input(s): FREET3  Telemetry    SR with PVCs  ECG    SR with diffuse ST changes  Radiology    Dg Chest 2 View  Result Date: 11/01/2015 CLINICAL DATA:  Chest pain and shortness of breath abnormality light ride this evening. EXAM: CHEST  2 VIEW COMPARISON:  None. FINDINGS: Normal cardiac silhouette and mediastinal contours. There is an ill-defined slightly wedge-shaped opacity within the peripheral aspect of the left mid lung. No pleural effusion or pneumothorax. No evidence of edema. No acute osseus abnormalities. IMPRESSION: Ill-defined wedge-shaped opacity with the peripheral aspect the left mid lung may represent an area of early infection though note, pulmonary infarction in the setting of pulmonary embolism could have a similar appearance. Clinical  correlation is advised. Further evaluation with chest CTA could be obtained as clinically indicated. Electronically Signed   By: Simonne Come M.D.   On: 11/01/2015 22:17  Ct Angio Chest Pe W/cm &/or Wo Cm  Result Date: 11/02/2015 CLINICAL DATA:  Abnormal chest chest radiograph. Acute onset of shortness of breath while mountain biking earlier today. EXAM: CT ANGIOGRAPHY CHEST WITH CONTRAST TECHNIQUE: Multidetector CT imaging of the chest was performed using the standard protocol during bolus administration of intravenous contrast. Multiplanar CT image reconstructions and MIPs were obtained to evaluate the vascular anatomy. CONTRAST:  100 cc Isovue 370 COMPARISON:  Chest radiograph-earlier same day FINDINGS: Vascular Findings: There is adequate opacification of the pulmonary arterial system with the main pulmonary artery measuring 422 Hounsfield units.  There are no discrete filling defects within the pulmonary arterial tree to the level of the bilateral subsegmental pulmonary arteries. Evaluation of the distal subsegmental pulmonary arteries is degraded secondary to patient respiratory artifact. Normal caliber of the main pulmonary artery. Normal heart size.  No pericardial effusion. Normal caliber of the thoracic aorta. No definite thoracic aortic dissection on this nongated examination. Bovine configuration of the aortic arch. The branch vessels of the aortic arch appear widely patent throughout their imaged course. 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 Review of the MIP images confirms the above findings. ---------------------------------------------------------------------------------- Nonvascular Findings: Mediastinum/Lymph Nodes: No bulky mediastinal, hilar axillary lymphadenopathy. Lungs/Pleura: Evaluation the pulmonary parenchyma is degraded secondary to patient respiratory artifact. There is no CT correlate for questioned wedge-shaped opacity with the peripheral aspect of the left upper/mid lung. Minimal dependent subpleural ground-glass atelectasis. No discrete focal airspace opacities. No pleural effusion or pneumothorax. The central pulmonary airways appear widely patent. No discrete pulmonary nodules. Upper abdomen: Early arterial phase evaluation of the upper abdomen is normal. Musculoskeletal: No acute or aggressive osseous abnormalities. Regional soft tissues appear normal. Normal appearance of the thyroid gland. IMPRESSION: No acute cardiopulmonary disease. Specifically, no evidence of pulmonary embolism. The questioned wedge-shaped opacity within the peripheral aspect of the left upper/mid lung is without CT correlate and favored to be artifactual due to the confluence of overlying osseous and soft tissue structures. Electronically Signed   By: Simonne Come M.D.   On: 11/02/2015 00:28   Assessment & Plan    50 yo male with PMH of  hyperlipidemia who presented to the [facility] ED for evaluation of chest pain. Reports being very active and started biking about 1 year ago, and losing about 20lbs. Yesterday experienced midsternal chest pain with nausea, dyspnea, and diaphoresis while driving back home from biking.   1. Unstable Angina: EKG in the ED showed diffuse ST changes, with initial trop neg, but 2nd trop 0.44. Was started on IV heparin on admission. Plans for possible LHC today. Currently without angina and resting comfortably. Reports family hx of cardiac diease with his father having an MI at age 68 with subsequent CABG. Reports being on a statin in the past, but has not been taking.  -- Lipid shows elevated cholesterol, and LDL. Statin ordered. BB started on admission. Loaded with Plavix -- Cath orders placed, follow up post cath.  Signed, Laverda Page NP-C Pager [phone]  I have seen and examined the patient along with Laverda Page NP-C.  I have reviewed the chart, notes and new data.  I agree with NP's note.  Key new complaints: now asymptomatic Key examination changes: normal CV exam Key new findings / data: mild increase in cardiac troponin, no real  ECG changes  PLAN: Unstable angina, for cath today. Left Circumflex disease? This procedure has been fully reviewed with the patient and written informed consent has been obtained.   Thurmon Fair, MD, [facility] [phone] 11/02/2015, 8:12 AM

## 2015-11-02 NOTE — H&P (Signed)
CC: CP  HPI:  50 yo [place] man, fairly active, history of DLD (took Simvastatin for couple years; stopped 8 months ago), MI in father in his 1's, brought to the ER by EMS for evaluation. He reports that he has started biking about a year ago and lost almost 20 lbs. Today he biked for over 4 miles without problems. However, afterwards, he started to midsternal chest pressure associated with nausea, SOB and diaphoresis what he described as "anxeity sweat" different than usual sweating during exercise. Symptoms persisted but still he chose to drive himself back home. He sat in his car but eventually notified his wife inside and decided to call 911 almost over 45 minutes of symptoms.  By the time EMS arrived, he already had started to feel a little better. He was given ASA and sl NTG with further improvement in his symptoms. Initial EKG from EMS showed diffuse ST, more pronounced in the lateral precordial leads. ECG in the ER showed sinus rhythm, J point elevation, no ST depression or ischemic  ST elevation. He is not CP free.   CXR in ER was reported as an abnormality in left lobe which was further evaluated by CTA chest negative for PE or any other acute abnormality.    Review of Systems:  10 systems reviewed unremarkable except as noted in HPI    Past Medical History:  Diagnosis Date  . High cholesterol 10/08/2014    No current facility-administered medications on file prior to encounter.    No current outpatient prescriptions on file prior to encounter.     No Known Allergies  Social History   Social History  . Marital status: Married    Spouse name: N/A  . Number of children: N/A  . Years of education: N/A   Occupational History  . Not on file.   Social History Main Topics  . Smoking status: Never Smoker  . Smokeless tobacco: Never Used  . Alcohol use No  . Drug use: No  . Sexual activity: Yes   Other Topics Concern  . Not on file   Social History Narrative  . No  narrative on file    History reviewed. No pertinent family history.  PHYSICAL EXAM: Vitals:   11/01/15 2300 11/01/15 2345  BP: 109/63 120/73  Pulse: 70 66  Resp: 16 13  Temp:     General:  Well appearing. No respiratory difficulty HEENT: normal Neck: supple. no JVD. Carotids 2+ bilat; no bruits. No lymphadenopathy or thryomegaly appreciated. Cor: PMI nondisplaced. Regular rate & rhythm. No rubs, gallops or murmurs. Lungs: clear Abdomen: soft, nontender, nondistended. No hepatosplenomegaly. No bruits or masses. Good bowel sounds. Extremities: no cyanosis, clubbing, rash, edema Neuro: alert & oriented x 3, cranial nerves grossly intact. moves all 4 extremities w/o difficulty. Affect pleasant.    Results for orders placed or performed during the hospital encounter of 11/01/15 (from the past 24 hour(s))  Basic metabolic panel     Status: Abnormal   Collection Time: 11/01/15  9:52 PM  Result Value Ref Range   Sodium 139 135 - 145 mmol/L   Potassium 4.0 3.5 - 5.1 mmol/L   Chloride 105 101 - 111 mmol/L   CO2 24 22 - 32 mmol/L   Glucose, Bld 124 (H) 65 - 99 mg/dL   BUN 13 6 - 20 mg/dL   Creatinine, Ser 5.78 (H) 0.61 - 1.24 mg/dL   Calcium 9.7 8.9 - 46.9 mg/dL   GFR calc non Af Denyse Dago  58 (L) >60 mL/min   GFR calc Af Amer >60 >60 mL/min   Anion gap 10 5 - 15  CBC     Status: Abnormal   Collection Time: 11/01/15  9:52 PM  Result Value Ref Range   WBC 15.1 (H) 4.0 - 10.5 K/uL   RBC 4.70 4.22 - 5.81 MIL/uL   Hemoglobin 13.9 13.0 - 17.0 g/dL   HCT 45.0 38.8 - 82.8 %   MCV 86.4 78.0 - 100.0 fL   MCH 29.6 26.0 - 34.0 pg   MCHC 34.2 30.0 - 36.0 g/dL   RDW 00.3 49.1 - 79.1 %   Platelets 249 150 - 400 K/uL  D-dimer, quantitative (not at [facility])     Status: None   Collection Time: 11/01/15  9:52 PM  Result Value Ref Range   D-Dimer, Quant <0.27 0.00 - 0.50 ug/mL-FEU  I-stat troponin, ED     Status: None   Collection Time: 11/01/15  9:57 PM  Result Value Ref Range   Troponin i, poc  0.06 0.00 - 0.08 ng/mL   Comment 3           Dg Chest 2 View  Result Date: 11/01/2015 CLINICAL DATA:  Chest pain and shortness of breath abnormality light ride this evening. EXAM: CHEST  2 VIEW COMPARISON:  None. FINDINGS: Normal cardiac silhouette and mediastinal contours. There is an ill-defined slightly wedge-shaped opacity within the peripheral aspect of the left mid lung. No pleural effusion or pneumothorax. No evidence of edema. No acute osseus abnormalities. IMPRESSION: Ill-defined wedge-shaped opacity with the peripheral aspect the left mid lung may represent an area of early infection though note, pulmonary infarction in the setting of pulmonary embolism could have a similar appearance. Clinical correlation is advised. Further evaluation with chest CTA could be obtained as clinically indicated. Electronically Signed   By: Simonne Come M.D.   On: 11/01/2015 22:17  Ct Angio Chest Pe W/cm &/or Wo Cm  Result Date: 11/02/2015 CLINICAL DATA:  Abnormal chest chest radiograph. Acute onset of shortness of breath while mountain biking earlier today. EXAM: CT ANGIOGRAPHY CHEST WITH CONTRAST TECHNIQUE: Multidetector CT imaging of the chest was performed using the standard protocol during bolus administration of intravenous contrast. Multiplanar CT image reconstructions and MIPs were obtained to evaluate the vascular anatomy. CONTRAST:  100 cc Isovue 370 COMPARISON:  Chest radiograph-earlier same day FINDINGS: Vascular Findings: There is adequate opacification of the pulmonary arterial system with the main pulmonary artery measuring 422 Hounsfield units. There are no discrete filling defects within the pulmonary arterial tree to the level of the bilateral subsegmental pulmonary arteries. Evaluation of the distal subsegmental pulmonary arteries is degraded secondary to patient respiratory artifact. Normal caliber of the main pulmonary artery. Normal heart size.  No pericardial effusion. Normal caliber of the  thoracic aorta. No definite thoracic aortic dissection on this nongated examination. Bovine configuration of the aortic arch. The branch vessels of the aortic arch appear widely patent throughout their imaged course. 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 Review of the MIP images confirms the above findings. ---------------------------------------------------------------------------------- Nonvascular Findings: Mediastinum/Lymph Nodes: No bulky mediastinal, hilar axillary lymphadenopathy. Lungs/Pleura: Evaluation the pulmonary parenchyma is degraded secondary to patient respiratory artifact. There is no CT correlate for questioned wedge-shaped opacity with the peripheral aspect of the left upper/mid lung. Minimal dependent subpleural ground-glass atelectasis. No discrete focal airspace opacities. No pleural effusion or pneumothorax. The central pulmonary airways appear widely patent. No discrete pulmonary nodules. Upper abdomen: Early arterial  phase evaluation of the upper abdomen is normal. Musculoskeletal: No acute or aggressive osseous abnormalities. Regional soft tissues appear normal. Normal appearance of the thyroid gland. IMPRESSION: No acute cardiopulmonary disease. Specifically, no evidence of pulmonary embolism. The questioned wedge-shaped opacity within the peripheral aspect of the left upper/mid lung is without CT correlate and favored to be artifactual due to the confluence of overlying osseous and soft tissue structures. Electronically Signed   By: Simonne Come M.D.   On: 11/02/2015 00:28    ASSESSMENT:  1. Unstable angina - his symptoms are concerning for ACS - at this time he CP has subsided; no evidence of acute HF or shock     PLAN/DISCUSSION:  Admit to cardiology  Will continue ASA 81 mg po qd, Heparin infusion, give Plavix 300 mg pox1 Start beta blocker and statin Check HbA1c, BNP, Lipids Serial Trop Plan for coronary angio in am Echo    Nevin Bloodgood, MD Cardiology

## 2015-11-02 NOTE — Research (Signed)
TWIL[facility] Research study presented to patient and wife. Patient states he would like to consent to enroll. Informed consent left for review. Questions encouraged and answered. Research will return in morning to consent if patient still desires.

## 2015-11-02 NOTE — Interval H&P Note (Signed)
Cath Lab Visit (complete for each Cath Lab visit)  Clinical Evaluation Leading to the Procedure:   ACS: Yes.    Non-ACS:    Anginal Classification: CCS IV  Anti-ischemic medical therapy: Minimal Therapy (1 class of medications)  Non-Invasive Test Results: No non-invasive testing performed  Prior CABG: No previous CABG      History and Physical Interval Note:  11/02/2015 11:26 AM  Lance Boyer  has presented today for surgery, with the diagnosis of cp  The various methods of treatment have been discussed with the patient and family. After consideration of risks, benefits and other options for treatment, the patient has consented to  Procedure(s): Left Heart Cath and Coronary Angiography (N/A) as a surgical intervention .  The patient's history has been reviewed, patient examined, no change in status, stable for surgery.  I have reviewed the patient's chart and labs.  Questions were answered to the patient's satisfaction.     Lance Muss

## 2015-11-02 NOTE — Progress Notes (Signed)
ANTICOAGULATION CONSULT NOTE - Follow Up Consult  Pharmacy Consult for Heparin Indication: chest pain/ACS  No Known Allergies  Patient Measurements: Height: 5\' 10"  (177.8 cm) Weight: 160 lb 3.2 oz (72.7 kg) IBW/kg (Calculated) : 73 Heparin Dosing Weight:  72.7 kg  Vital Signs: Temp: 97.9 F (36.6 C) (07/26 0440) Temp Source: Oral (07/26 0440) BP: 115/64 (07/26 0440) Pulse Rate: 63 (07/26 0440)  Labs:  Recent Labs  11/01/15 2152 11/02/15 0145 11/02/15 0429 11/02/15 0847  HGB 13.9 13.8  --   --   HCT 40.6 42.0  --   --   PLT 249 262  --   --   HEPARINUNFRC  --  0.79* 0.45 0.40  CREATININE 1.41*  --   --   --   TROPONINI  --  0.44*  --  0.70*    Estimated Creatinine Clearance: 65.9 mL/min (by C-G formula based on SCr of 1.41 mg/dL).  Assessment: 50 yo m presenting with central CP - was biking and felt pressure, became diaphoretic and SOB - no meds PTA. Enzymes slightly +.EKG in the ED showed diffuse ST changes  PMH: HLD   AC: IV hep for r/o ACS. HL 0.4 in goal. CBC WNL   Goal of Therapy:  Heparin level 0.3-0.7 units/ml Monitor platelets by anticoagulation protocol: Yes   Plan:  Loaded Plavix. Cath today. Continue heparin 750 units/hr  Sonal Dorwart S. Merilynn Finland, PharmD, BCPS Clinical Staff Pharmacist Pager [phone]   Misty Stanley Stillinger 11/02/2015,10:29 AM

## 2015-11-02 NOTE — Progress Notes (Signed)
Patient Name: Lance Boyer Date of Encounter: 11/02/2015  Hospital Problem List     Active Problems:   Unstable angina (HCC)    Subjective   No angina at this time. Resting comfortably.   Inpatient Medications    . aspirin EC  81 mg Oral Daily  . atorvastatin  40 mg Oral q1800  . metoprolol tartrate  12.5 mg Oral BID    Vital Signs    Vitals:   11/02/15 0100 11/02/15 0115 11/02/15 0132 11/02/15 0440  BP: 125/81  120/72 115/64  Pulse: 65  66 63  Resp: Temp:  98.3 F (36.8 C) 98.1 F (36.7 C) 97.9 F (36.6 C)  TempSrc:  Oral Oral Oral  SpO2: 100%  100% 99%  Weight:   160 lb 3.2 oz (72.7 kg)   Height:    (1.778 m)    No intake or output data in the 24 hours ending 11/02/15 0709 Filed Weights   11/01/15 2145 11/02/15 0132  Weight: 160 lb (72.6 kg) 160 lb 3.2 oz (72.7 kg)    Physical Exam    General: Pleasant, NAD. Neuro: Alert and oriented X 3. Moves all extremities spontaneously. Psych: Normal affect. HEENT:  Normal  Neck: Supple without bruits or JVD. Lungs:  Resp regular and unlabored, CTA. Heart: RRR no s3, s4, or murmurs. Abdomen: Soft, non-tender, non-distended, BS + x 4.  Extremities: No clubbing, cyanosis or edema. DP/PT/Radials 2+ and equal bilaterally.  Labs    CBC  Recent Labs  11/01/15 2152 11/02/15 0145  WBC 15.1* 12.4*  HGB 13.9 13.8  HCT 40.6 42.0  MCV 86.4 86.4  PLT 249 262   Basic Metabolic Panel  Recent Labs  11/01/15 2152 11/02/15 0145  NA 139  --   K 4.0  --   CL 105  --   CO2 24  --   GLUCOSE 124*  --   BUN 13  --   CREATININE 1.41*  --   CALCIUM 9.7  --   MG  --  2.5*   Liver Function Tests No results for input(s): AST, ALT, ALKPHOS, BILITOT, PROT, ALBUMIN in the last 72 hours. No results for input(s): LIPASE, AMYLASE in the last 72 hours. Cardiac Enzymes  Recent Labs  11/02/15 0145  TROPONINI 0.44*   BNP Invalid input(s): POCBNP D-Dimer  Recent Labs  11/01/15 2152  DDIMER <0.27     Hemoglobin A1C No results for input(s): HGBA1C in the last 72 hours. Fasting Lipid Panel  Recent Labs  11/02/15 0429  CHOL 256*  HDL 74  LDLCALC 169*  TRIG 64  CHOLHDL 3.5   Thyroid Function Tests No results for input(s): TSH, T4TOTAL, T3FREE, THYROIDAB in the last 72 hours.  Invalid input(s): FREET3  Telemetry    SR with PVCs  ECG    SR with diffuse ST changes  Radiology    Dg Chest 2 View  Result Date: 11/01/2015 CLINICAL DATA:  Chest pain and shortness of breath abnormality light ride this evening. EXAM: CHEST  2 VIEW COMPARISON:  None. FINDINGS: Normal cardiac silhouette and mediastinal contours. There is an ill-defined slightly wedge-shaped opacity within the peripheral aspect of the left mid lung. No pleural effusion or pneumothorax. No evidence of edema. No acute osseus abnormalities. IMPRESSION: Ill-defined wedge-shaped opacity with the peripheral aspect the left mid lung may represent an area of early infection though note, pulmonary infarction in the setting of pulmonary embolism could have a similar appearance. Clinical  correlation is advised. Further evaluation with chest CTA could be obtained as clinically indicated. Electronically Signed   By: Simonne Come M.D.   On: 11/01/2015 22:17  Ct Angio Chest Pe W/cm &/or Wo Cm  Result Date: 11/02/2015 CLINICAL DATA:  Abnormal chest chest radiograph. Acute onset of shortness of breath while mountain biking earlier today. EXAM: CT ANGIOGRAPHY CHEST WITH CONTRAST TECHNIQUE: Multidetector CT imaging of the chest was performed using the standard protocol during bolus administration of intravenous contrast. Multiplanar CT image reconstructions and MIPs were obtained to evaluate the vascular anatomy. CONTRAST:  100 cc Isovue 370 COMPARISON:  Chest radiograph-earlier same day FINDINGS: Vascular Findings: There is adequate opacification of the pulmonary arterial system with the main pulmonary artery measuring 422 Hounsfield units.  There are no discrete filling defects within the pulmonary arterial tree to the level of the bilateral subsegmental pulmonary arteries. Evaluation of the distal subsegmental pulmonary arteries is degraded secondary to patient respiratory artifact. Normal caliber of the main pulmonary artery. Normal heart size.  No pericardial effusion. Normal caliber of the thoracic aorta. No definite thoracic aortic dissection on this nongated examination. Bovine configuration of the aortic arch. The branch vessels of the aortic arch appear widely patent throughout their imaged course. 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 Review of the MIP images confirms the above findings. ---------------------------------------------------------------------------------- Nonvascular Findings: Mediastinum/Lymph Nodes: No bulky mediastinal, hilar axillary lymphadenopathy. Lungs/Pleura: Evaluation the pulmonary parenchyma is degraded secondary to patient respiratory artifact. There is no CT correlate for questioned wedge-shaped opacity with the peripheral aspect of the left upper/mid lung. Minimal dependent subpleural ground-glass atelectasis. No discrete focal airspace opacities. No pleural effusion or pneumothorax. The central pulmonary airways appear widely patent. No discrete pulmonary nodules. Upper abdomen: Early arterial phase evaluation of the upper abdomen is normal. Musculoskeletal: No acute or aggressive osseous abnormalities. Regional soft tissues appear normal. Normal appearance of the thyroid gland. IMPRESSION: No acute cardiopulmonary disease. Specifically, no evidence of pulmonary embolism. The questioned wedge-shaped opacity within the peripheral aspect of the left upper/mid lung is without CT correlate and favored to be artifactual due to the confluence of overlying osseous and soft tissue structures. Electronically Signed   By: Simonne Come M.D.   On: 11/02/2015 00:28   Assessment & Plan    50 yo male with PMH of  hyperlipidemia who presented to the [facility] ED for evaluation of chest pain. Reports being very active and started biking about 1 year ago, and losing about 20lbs. Yesterday experienced midsternal chest pain with nausea, dyspnea, and diaphoresis while driving back home from biking.   1. Unstable Angina: EKG in the ED showed diffuse ST changes, with initial trop neg, but 2nd trop 0.44. Was started on IV heparin on admission. Plans for possible LHC today. Currently without angina and resting comfortably. Reports family hx of cardiac diease with his father having an MI at age 68 with subsequent CABG. Reports being on a statin in the past, but has not been taking.  -- Lipid shows elevated cholesterol, and LDL. Statin ordered. BB started on admission. Loaded with Plavix -- Cath orders placed, follow up post cath.  Signed, Laverda Page NP-C Pager [phone]  I have seen and examined the patient along with Laverda Page NP-C.  I have reviewed the chart, notes and new data.  I agree with NP's note.  Key new complaints: now asymptomatic Key examination changes: normal CV exam Key new findings / data: mild increase in cardiac troponin, no real  ECG changes  PLAN: Unstable angina, for cath today. Left Circumflex disease? This procedure has been fully reviewed with the patient and written informed consent has been obtained.   Thurmon Fair, MD, [facility] [phone] 11/02/2015, 8:12 AM

## 2015-11-02 NOTE — Progress Notes (Signed)
ANTICOAGULATION CONSULT NOTE - Follow-up Consult  Pharmacy Consult for heparin Indication: chest pain/ACS  No Known Allergies  Patient Measurements: Height: 5\' 10"  (177.8 cm) Weight: 160 lb 3.2 oz (72.7 kg) IBW/kg (Calculated) : 73 Heparin Dosing Weight: 72 kg  Vital Signs: Temp: 98.1 F (36.7 C) (07/26 0132) Temp Source: Oral (07/26 0132) BP: 120/72 (07/26 0132) Pulse Rate: 66 (07/26 0132)  Labs:  Recent Labs  11/01/15 2152 11/02/15 0145  HGB 13.9 13.8  HCT 40.6 42.0  PLT 249 262  HEPARINUNFRC  --  0.79*  CREATININE 1.41*  --     Estimated Creatinine Clearance: 65.9 mL/min (by C-G formula based on SCr of 1.41 mg/dL).  Assessment: 50 yo m on heparin for r/o ACS. Heparin level supratherapeutic on 850 units/hr (drawn ~4 hrs post start so may be falsely elevated due to proximity to bolus). CBC stable. No issues with line or bleeding reported per RN.  Goal of Therapy:  Heparin level 0.3-0.7 units/ml Monitor platelets by anticoagulation protocol: Yes   Plan:  Decrease heparin infusion to 750 units/hr F/u 6 hour heparin level  Christoper Fabian, PharmD, BCPS Clinical pharmacist, pager [phone] 11/02/2015 2:31 AM

## 2015-11-03 ENCOUNTER — Other Ambulatory Visit: Payer: Self-pay | Admitting: *Deleted

## 2015-11-03 ENCOUNTER — Telehealth: Payer: Self-pay | Admitting: Cardiovascular Disease

## 2015-11-03 ENCOUNTER — Encounter (HOSPITAL_COMMUNITY): Payer: Self-pay | Admitting: Cardiology

## 2015-11-03 DIAGNOSIS — E785 Hyperlipidemia, unspecified: Secondary | ICD-10-CM

## 2015-11-03 DIAGNOSIS — I214 Non-ST elevation (NSTEMI) myocardial infarction: Principal | ICD-10-CM

## 2015-11-03 LAB — BASIC METABOLIC PANEL
Anion gap: 8 (ref 5–15)
BUN: 15 mg/dL (ref 6–20)
CHLORIDE: 107 mmol/L (ref 101–111)
CO2: 22 mmol/L (ref 22–32)
Calcium: 9.1 mg/dL (ref 8.9–10.3)
Creatinine, Ser: 1.09 mg/dL (ref 0.61–1.24)
GFR calc non Af Amer: >60 mL/min (ref >60)
Glucose, Bld: 97 mg/dL (ref 65–99)
POTASSIUM: 4.1 mmol/L (ref 3.5–5.1)
SODIUM: 137 mmol/L (ref 135–145)

## 2015-11-03 LAB — HEMOGLOBIN A1C
Hgb A1c MFr Bld: 5.4 % (ref 4.8–5.6)
MEAN PLASMA GLUCOSE: 108 mg/dL

## 2015-11-03 LAB — CBC
HCT: 38.2 % — ABNORMAL LOW (ref 39.0–52.0)
HEMOGLOBIN: 12.7 g/dL — AB (ref 13.0–17.0)
MCH: 28.5 pg (ref 26.0–34.0)
MCHC: 33.2 g/dL (ref 30.0–36.0)
MCV: 85.8 fL (ref 78.0–100.0)
Platelets: 213 10*3/uL (ref 150–400)
RBC: 4.45 MIL/uL (ref 4.22–5.81)
RDW: 13.8 % (ref 11.5–15.5)
WBC: 9.4 10*3/uL (ref 4.0–10.5)

## 2015-11-03 MED ORDER — METOPROLOL TARTRATE 25 MG PO TABS: 12.5000 mg | ORAL_TABLET | Freq: Two times a day (BID) | ORAL | 11 refills | Status: DC

## 2015-11-03 MED ORDER — TICAGRELOR 90 MG PO TABS: 90.0000 mg | ORAL_TABLET | Freq: Two times a day (BID) | ORAL | Status: DC

## 2015-11-03 MED ORDER — ASPIRIN 81 MG PO TBEC: 81.0000 mg | DELAYED_RELEASE_TABLET | Freq: Every day | ORAL | Status: DC

## 2015-11-03 MED ORDER — ATORVASTATIN CALCIUM 80 MG PO TABS: 80.0000 mg | ORAL_TABLET | Freq: Every day | ORAL | 11 refills | Status: DC

## 2015-11-03 MED ORDER — ATORVASTATIN CALCIUM 80 MG PO TABS: 80.0000 mg | ORAL_TABLET | Freq: Every day | ORAL | Status: DC

## 2015-11-03 MED ORDER — NITROGLYCERIN 0.4 MG SL SUBL: 0.4000 mg | SUBLINGUAL_TABLET | SUBLINGUAL | 3 refills | Status: DC | PRN

## 2015-11-03 MED FILL — Heparin Sodium (Porcine) Inj 1000 Unit/ML: INTRAMUSCULAR | Qty: 10 | Status: AC

## 2015-11-03 NOTE — Progress Notes (Signed)
CARDIAC REHAB PHASE I   PRE:  Rate/Rhythm: 82 SR  BP:  Sitting: 127/63        SaO2: 99 RA  MODE:  Ambulation: 800 ft   POST:  Rate/Rhythm: 96 SR  BP:  Sitting: 127/74         SaO2: 99 RA  Pt ambulated 800 ft on RA, independent, steady gait, tolerated well with no complaints. Completed MI/stent education with pt and wife at bedside.  Reviewed risk factors, MI book, anti-platelet therapy, stent card, activity restrictions, ntg, exercise, heart healthy diet, and phase 2 cardiac rehab. Pt verbalized understanding, very receptive to educaiton. Pt agrees to phase 2 cardiac rehab referral, will send to [facility] per pt request. Pt on brilinta, does not have insurance, pt is considering twilight study, if not, will need follow-up with case manager prior to discharge. Pt to bed per pt request after walk, call bell within reach. Will follow.  [phone] Joylene Grapes, RN, BSN 11/03/2015 9:13 AM

## 2015-11-03 NOTE — Care Management Note (Signed)
Case Management Note  Patient Details  Name: Lance Boyer MRN: [id number] Date of Birth: 1965-12-10  Subjective/Objective:     Patient lives with spouse, pta indep, He will be participating in the [employer] for [place].  He does not have insurance right now but will be effective with [employer] on Aug 1.  He states he will get his pcp then to follow up with. He will be on lipitor 80mg  daily along with the Brilinta.                 Action/Plan:   Expected Discharge Date:                  Expected Discharge Plan:  Home/Self Care  In-House Referral:     Discharge planning Services  CM Consult  Post Acute Care Choice:    Choice offered to:     DME Arranged:    DME Agency:     HH Arranged:    HH Agency:     Status of Service:  Completed, signed off  If discussed at [employer] of Stay Meetings, dates discussed:    Additional Comments:  Leone Haven, RN 11/03/2015, 9:20 AM

## 2015-11-03 NOTE — Discharge Summary (Signed)
Discharge Summary    Patient ID: Lance Boyer,  MRN: [id number], DOB/AGE: 13-Jan-1966 50 y.o.  Admit date: 11/01/2015 Discharge date: 11/03/2015  Primary Care Provider: Joycelyn Rua Primary Cardiologist: Dr. Royann Shivers  Discharge Diagnoses    Active Problems:   Unstable angina [facility])   NSTEMI (non-ST elevated myocardial infarction) [facility])   Hyperlipemia   Allergies No Known Allergies  Diagnostic Studies/Procedures    LHC 11/02/2015  Conclusion     A STENT SYNERGY DES 3X28 drug eluting stent was successfully placed in the proximal to mid LAD to treat the culprit lesion.  Post intervention, there is a 0% residual stenosis.  A STENT SYNERGY DES 3.5X24 drug eluting stent was successfully placed in the proximal PDA.  Post intervention, there is a 0% residual stenosis.  LV end diastolic pressure is mildly elevated.  There is no aortic valve stenosis.   Continue dual antiplatelet therapy including aspirin Brilinta for at least a year. He is a candidate for the TWILIGHT study. He will need lipid-lowering therapy and aggressive secondary prevention.   Diagnostic Diagram     Post-Intervention Diagram      ECHO 11/02/2015  Study Conclusions  - Left ventricle: The cavity size was normal. Wall thickness was   normal. Systolic function was normal. The estimated ejection   fraction was in the range of 55% to 60%. Wall motion was normal;   there were no regional wall motion abnormalities. There was no   evidence of elevated ventricular filling pressure by Doppler   parameters. - Aortic valve: There was trivial regurgitation. - Right atrium: The atrium was mildly dilated. - Pulmonic valve: Peak gradient (S): 11 mm Hg. _____________   History of Present Illness     50 yo [place] man, fairly active, history of DLD (took Simvastatin for couple years; stopped 8 months ago), father had an MI in his 38's. He was brought to the ER by EMS for evaluation. He reports that he has  started biking about a year ago and lost almost 20 lbs. On the day of admission he biked for over 4 miles without problems. However, afterwards, he started to midsternal chest pressure associated with nausea, SOB and diaphoresis what he described as "anxeity sweat" different than usual sweating during exercise. Symptoms persisted but still he was able to drive himself back home. He sat in his car but eventually notified his wife inside and decided to call 911 after almost over 45 minutes of symptoms.  By the time EMS arrived, he already had started to feel a little better. He was given ASA and sl NTG with further improvement in his symptoms. Initial EKG from EMS showed diffuse ST depression and elevation in aVR, more pronounced in the lateral precordial leads. ECG in the ER showed sinus rhythm, J point elevation, no ST depression or ischemic  ST elevation. Dr. Tresa Endo was consulted who advised to start heparin, and the patient was admitted by cardiology fellow.   Hospital Course     Consultants: None  He was admitted to our service, started on heparin gtt and loaded with Plavix 300mg  x1. Also started on metoprolol 12.5mg  BID, and restarted on home statin. His lipid panel showed poor control with Cholesterol 256, and LDL 169. He underwent LHC with Dr. Eldridge Dace showing 90% lesion to the mid LAD and 75% lesion to prox RCA, both treated with DES. Plan to continue on DAPT for at least a year (ASA, Brilinta). 2D echo showed EF of 55-60% with NWMA.  He was ambulated with Cardiac rehab without angina or dyspnea. Trop peaked at 0.9. His atorvastatin was increase to . Plan to continue him on low dose beta-blocker and follow at appt to determine his tolerance. His labs, cath site and vital signs the morning after cath are stable. He was seen and assessed by Dr. Royann Shivers and determined stable for discharge. I have arranged a TOC appt, and he will be participating in the the twilight study. Brilinta has been given to  him prior to discharge.  _____________  Discharge Vitals Blood pressure 127/74, pulse 80, temperature 98.1 F (36.7 C), temperature source Oral, resp. rate 16, height  (1.778 m), weight 160 lb 15 oz (73 kg), SpO2 97 %.  Filed Weights   11/01/15 2145 11/02/15 0132 11/03/15 0433  Weight: 160 lb (72.6 kg) 160 lb 3.2 oz (72.7 kg) 160 lb 15 oz (73 kg)    Labs & Radiologic Studies    CBC  Recent Labs  11/02/15 1430 11/03/15 0419  WBC 8.0 9.4  HGB 12.9* 12.7*  HCT 38.9* 38.2*  MCV 86.1 85.8  PLT 215 213   Basic Metabolic Panel  Recent Labs  11/01/15 2152 11/02/15 0145 11/02/15 1430 11/03/15 0419  NA 139  --   --  137  K 4.0  --   --  4.1  CL 105  --   --  107  CO2 24  --   --  22  GLUCOSE 124*  --   --  97  BUN 13  --   --  15  CREATININE 1.41*  --  1.03 1.09  CALCIUM 9.7  --   --  9.1  MG  --  2.5*  --   --    Liver Function Tests No results for input(s): AST, ALT, ALKPHOS, BILITOT, PROT, ALBUMIN in the last 72 hours. No results for input(s): LIPASE, AMYLASE in the last 72 hours. Cardiac Enzymes  Recent Labs  11/02/15 0145 11/02/15 0847 11/02/15 1430  TROPONINI 0.44* 0.70* 0.90*   BNP Invalid input(s): POCBNP D-Dimer  Recent Labs  11/01/15 2152  DDIMER <0.27   Hemoglobin A1C  Recent Labs  11/02/15 0145  HGBA1C 5.4   Fasting Lipid Panel  Recent Labs  11/02/15 0429  CHOL 256*  HDL 74  LDLCALC 169*  TRIG 64  CHOLHDL 3.5   Thyroid Function Tests No results for input(s): TSH, T4TOTAL, T3FREE, THYROIDAB in the last 72 hours.  Invalid input(s): FREET3 _____________  Dg Chest 2 View  Result Date: 11/01/2015 CLINICAL DATA:  Chest pain and shortness of breath abnormality light ride this evening. EXAM: CHEST  2 VIEW COMPARISON:  None. FINDINGS: Normal cardiac silhouette and mediastinal contours. There is an ill-defined slightly wedge-shaped opacity within the peripheral aspect of the left mid lung. No pleural effusion or pneumothorax. No  evidence of edema. No acute osseus abnormalities. IMPRESSION: Ill-defined wedge-shaped opacity with the peripheral aspect the left mid lung may represent an area of early infection though note, pulmonary infarction in the setting of pulmonary embolism could have a similar appearance. Clinical correlation is advised. Further evaluation with chest CTA could be obtained as clinically indicated. Electronically Signed   By: Simonne Come M.D.   On: 11/01/2015 22:17  Ct Angio Chest Pe W/cm &/or Wo Cm  Result Date: 11/02/2015 CLINICAL DATA:  Abnormal chest chest radiograph. Acute onset of shortness of breath while mountain biking earlier today. EXAM: CT ANGIOGRAPHY CHEST WITH CONTRAST TECHNIQUE: Multidetector CT imaging of the chest  was performed using the standard protocol during bolus administration of intravenous contrast. Multiplanar CT image reconstructions and MIPs were obtained to evaluate the vascular anatomy. CONTRAST:  100 cc Isovue 370 COMPARISON:  Chest radiograph-earlier same day FINDINGS: Vascular Findings: There is adequate opacification of the pulmonary arterial system with the main pulmonary artery measuring 422 Hounsfield units. There are no discrete filling defects within the pulmonary arterial tree to the level of the bilateral subsegmental pulmonary arteries. Evaluation of the distal subsegmental pulmonary arteries is degraded secondary to patient respiratory artifact. Normal caliber of the main pulmonary artery. Normal heart size.  No pericardial effusion. Normal caliber of the thoracic aorta. No definite thoracic aortic dissection on this nongated examination. Bovine configuration of the aortic arch. The branch vessels of the aortic arch appear widely patent throughout their imaged course. 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 Review of the MIP images confirms the above findings. ---------------------------------------------------------------------------------- Nonvascular Findings:  Mediastinum/Lymph Nodes: No bulky mediastinal, hilar axillary lymphadenopathy. Lungs/Pleura: Evaluation the pulmonary parenchyma is degraded secondary to patient respiratory artifact. There is no CT correlate for questioned wedge-shaped opacity with the peripheral aspect of the left upper/mid lung. Minimal dependent subpleural ground-glass atelectasis. No discrete focal airspace opacities. No pleural effusion or pneumothorax. The central pulmonary airways appear widely patent. No discrete pulmonary nodules. Upper abdomen: Early arterial phase evaluation of the upper abdomen is normal. Musculoskeletal: No acute or aggressive osseous abnormalities. Regional soft tissues appear normal. Normal appearance of the thyroid gland. IMPRESSION: No acute cardiopulmonary disease. Specifically, no evidence of pulmonary embolism. The questioned wedge-shaped opacity within the peripheral aspect of the left upper/mid lung is without CT correlate and favored to be artifactual due to the confluence of overlying osseous and soft tissue structures. Electronically Signed   By: Simonne Come M.D.   On: 11/02/2015 00:28  Disposition   Pt is being discharged home today in good condition.  Follow-up Plans & Appointments    Follow-up Information    Robbie Lis, PA-C Follow up on 11/07/2015.   Specialties:  Cardiology, Radiology Why:  9:30 am with Brittainy Dr. Erin Hearing PA for your follow up appt.  Contact information: [address] [place] [address] [phone]          Discharge Instructions    Amb Referral to Cardiac Rehabilitation    Complete by:  As directed   Diagnosis:   NSTEMI Coronary Stents     Call MD for:  redness, tenderness, or signs of infection (pain, swelling, redness, odor or green/yellow discharge around incision site)    Complete by:  As directed   Diet - low sodium heart healthy    Complete by:  As directed   Discharge instructions    Complete by:  As directed   Radial  Site Care Refer to this sheet in the next few weeks. These instructions provide you with information on caring for yourself after your procedure. Your caregiver may also give you more specific instructions. Your treatment has been planned according to current medical practices, but problems sometimes occur. Call your caregiver if you have any problems or questions after your procedure. HOME CARE INSTRUCTIONS You may shower the day after the procedure.Remove the bandage (dressing) and gently wash the site with plain soap and water.Gently pat the site dry.  Do not apply powder or lotion to the site.  Do not submerge the affected site in water for 3 to 5 days.  Inspect the site at least twice daily.  Do not flex or bend the affected arm for 24 hours.  No lifting over 5 pounds (2.3 kg) for 5 days after your procedure.  Do not drive home if you are discharged the same day of the procedure. Have someone else drive you.  You may drive 24 hours after the procedure unless otherwise instructed by your caregiver.  What to expect: Any bruising will usually fade within 1 to 2 weeks.  Blood that collects in the tissue (hematoma) may be painful to the touch. It should usually decrease in size and tenderness within 1 to 2 weeks.  SEEK IMMEDIATE MEDICAL CARE IF: You have unusual pain at the radial site.  You have redness, warmth, swelling, or pain at the radial site.  You have drainage (other than a small amount of blood on the dressing).  You have chills.  You have a fever or persistent symptoms for more than 72 hours.  You have a fever and your symptoms suddenly get worse.  Your arm becomes pale, cool, tingly, or numb.  You have heavy bleeding from the site. Hold pressure on the site.   Increase activity slowly    Complete by:  As directed      Discharge Medications   Current Discharge Medication List    START taking these medications   Details  aspirin EC 81 MG EC tablet Take 1 tablet (81 mg  total) by mouth daily.    atorvastatin (LIPITOR) 80 MG tablet Take 1 tablet (80 mg total) by mouth daily at 6 PM. Qty: 30 tablet, Refills: 11    metoprolol tartrate (LOPRESSOR) 25 MG tablet Take 0.5 tablets (12.5 mg total) by mouth 2 (two) times daily. Qty: 60 tablet, Refills: 11    nitroGLYCERIN (NITROSTAT) 0.4 MG SL tablet Place 1 tablet (0.4 mg total) under the tongue every 5 (five) minutes x 3 doses as needed for chest pain. Qty: 25 tablet, Refills: 3  Brilinta 90mg  tablet                              Take 1 tablet (90mg ) 2 times a day                               Aspirin prescribed at discharge?  Yes High Intensity Statin Prescribed? (Lipitor 40-80mg  or Crestor 20-40mg ): Yes Beta Blocker Prescribed? Yes For EF <40%, was ACEI/ARB Prescribed? No:  ADP Receptor Inhibitor Prescribed? (i.e. Plavix etc.-Includes Medically Managed Patients): Yes For EF <40%, Aldosterone Inhibitor Prescribed? No:  Was EF assessed during THIS hospitalization? Yes Was Cardiac Rehab II ordered? (Included Medically managed Patients): Yes   Outstanding Labs/Studies   None  Duration of Discharge Encounter   Greater than 30 minutes including physician time.  Signed, Laverda Page NP-C 11/03/2015, 10:59 AM

## 2015-11-03 NOTE — Research (Signed)
TWILIGHT Informed Consent   Subject Name: Quadarius Wernli  Subject met inclusion and exclusion criteria.  The informed consent form, study requirements and expectations were reviewed with the subject and questions and concerns were addressed prior to the signing of the consent form.  The subject verbalized understanding of the trail requirements.  The subject agreed to participate in the TWILIGHT trial and signed the informed consent.  The informed consent was obtained prior to performance of any protocol-specific procedures for the subject.  A copy of the signed informed consent was given to the subject and a copy was placed in the subject's medical record.  Berneda Rose 11/03/2015, 10:25 AM

## 2015-11-03 NOTE — Research (Signed)
Patient is enrolled in the [place]. Study drug was given to the patient. Patient was given instructions and verbalized understanding.

## 2015-11-03 NOTE — Telephone Encounter (Signed)
New Message    Toc appt is on 12/08/15 at 9:30.

## 2015-11-03 NOTE — Progress Notes (Signed)
Patient Name: Lance Boyer Date of Encounter: 11/03/2015  Active Problems:   Unstable angina (HCC)   NSTEMI (non-ST elevated myocardial infarction) (HCC)   Length of Stay: 1  SUBJECTIVE  No angina or dyspnea. No problems at radial or femoral access sites.  CURRENT MEDS . aspirin EC  81 mg Oral Daily  . atorvastatin  40 mg Oral q1800  . enoxaparin (LOVENOX) injection  40 mg Subcutaneous Q24H  . metoprolol tartrate  12.5 mg Oral BID  . sodium chloride flush  3 mL Intravenous Q12H  . ticagrelor  90 mg Oral BID    OBJECTIVE   Intake/Output Summary (Last 24 hours) at 11/03/15 0851 Last data filed at 11/02/15 2132  Gross per 24 hour  Intake           377.51 ml  Output              500 ml  Net          -122.49 ml   Filed Weights   11/01/15 2145 11/02/15 0132 11/03/15 0433  Weight: 72.6 kg (160 lb) 72.7 kg (160 lb 3.2 oz) 73 kg (160 lb 15 oz)    PHYSICAL EXAM Vitals:   11/02/15 1945 11/02/15 2000 11/03/15 0433 11/03/15 0834  BP: 135/70 118/69 (!) 115/58 127/74  Pulse: 73 78 67 80  Resp: Temp: 98.3 F (36.8 C)  97.7 F (36.5 C) 98.1 F (36.7 C)  TempSrc: Oral  Oral Oral  SpO2: 99% 99% 99% 97%  Weight:   73 kg (160 lb 15 oz)   Height:       General: Alert, oriented x3, no distress Head: no evidence of trauma, PERRL, EOMI, no exophtalmos or lid lag, no myxedema, no xanthelasma; normal ears, nose and oropharynx Neck: normal jugular venous pulsations and no hepatojugular reflux; brisk carotid pulses without delay and no carotid bruits Chest: clear to auscultation, no signs of consolidation by percussion or palpation, normal fremitus, symmetrical and full respiratory excursions Cardiovascular: normal position and quality of the apical impulse, regular rhythm, normal first and second heart sounds, no rubs or gallops, no murmur Abdomen: no tenderness or distention, no masses by palpation, no abnormal pulsatility or arterial bruits, normal bowel sounds, no  hepatosplenomegaly Extremities: no clubbing, cyanosis or edema; 2+ radial, ulnar and brachial pulses bilaterally; 2+ right femoral, posterior tibial and dorsalis pedis pulses; 2+ left femoral, posterior tibial and dorsalis pedis pulses; no subclavian or femoral bruits Neurological: grossly nonfocal  LABS  CBC  Recent Labs  11/02/15 1430 11/03/15 0419  WBC 8.0 9.4  HGB 12.9* 12.7*  HCT 38.9* 38.2*  MCV 86.1 85.8  PLT 215 213   Basic Metabolic Panel  Recent Labs  11/01/15 2152 11/02/15 0145 11/02/15 1430 11/03/15 0419  NA 139  --   --  137  K 4.0  --   --  4.1  CL 105  --   --  107  CO2 24  --   --  22  GLUCOSE 124*  --   --  97  BUN 13  --   --  15  CREATININE 1.41*  --  1.03 1.09  CALCIUM 9.7  --   --  9.1  MG  --  2.5*  --   --    Liver Function Tests No results for input(s): AST, ALT, ALKPHOS, BILITOT, PROT, ALBUMIN in the last 72 hours. No results for input(s): LIPASE, AMYLASE in the last 72 hours. Cardiac Enzymes  Recent Labs  11/02/15 0145 11/02/15 0847 11/02/15 1430  TROPONINI 0.44* 0.70* 0.90*   BNP Invalid input(s): POCBNP D-Dimer  Recent Labs  11/01/15 2152  DDIMER <0.27   Hemoglobin A1C  Recent Labs  11/02/15 0145  HGBA1C 5.4   Fasting Lipid Panel  Recent Labs  11/02/15 0429  CHOL 256*  HDL 74  LDLCALC 169*  TRIG 64  CHOLHDL 3.5   Thyroid Function Tests No results for input(s): TSH, T4TOTAL, T3FREE, THYROIDAB in the last 72 hours.  Invalid input(s): FREET3  Radiology Studies Imaging results have been reviewed and Dg Chest 2 View  Result Date: 11/01/2015 CLINICAL DATA:  Chest pain and shortness of breath abnormality light ride this evening. EXAM: CHEST  2 VIEW COMPARISON:  None. FINDINGS: Normal cardiac silhouette and mediastinal contours. There is an ill-defined slightly wedge-shaped opacity within the peripheral aspect of the left mid lung. No pleural effusion or pneumothorax. No evidence of edema. No acute osseus  abnormalities. IMPRESSION: Ill-defined wedge-shaped opacity with the peripheral aspect the left mid lung may represent an area of early infection though note, pulmonary infarction in the setting of pulmonary embolism could have a similar appearance. Clinical correlation is advised. Further evaluation with chest CTA could be obtained as clinically indicated. Electronically Signed   By: Simonne Come M.D.   On: 11/01/2015 22:17  Ct Angio Chest Pe W/cm &/or Wo Cm  Result Date: 11/02/2015 CLINICAL DATA:  Abnormal chest chest radiograph. Acute onset of shortness of breath while mountain biking earlier today. EXAM: CT ANGIOGRAPHY CHEST WITH CONTRAST TECHNIQUE: Multidetector CT imaging of the chest was performed using the standard protocol during bolus administration of intravenous contrast. Multiplanar CT image reconstructions and MIPs were obtained to evaluate the vascular anatomy. CONTRAST:  100 cc Isovue 370 COMPARISON:  Chest radiograph-earlier same day FINDINGS: Vascular Findings: There is adequate opacification of the pulmonary arterial system with the main pulmonary artery measuring 422 Hounsfield units. There are no discrete filling defects within the pulmonary arterial tree to the level of the bilateral subsegmental pulmonary arteries. Evaluation of the distal subsegmental pulmonary arteries is degraded secondary to patient respiratory artifact. Normal caliber of the main pulmonary artery. Normal heart size.  No pericardial effusion. Normal caliber of the thoracic aorta. No definite thoracic aortic dissection on this nongated examination. Bovine configuration of the aortic arch. The branch vessels of the aortic arch appear widely patent throughout their imaged course. 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 Review of the MIP images confirms the above findings. ---------------------------------------------------------------------------------- Nonvascular Findings: Mediastinum/Lymph Nodes: No bulky  mediastinal, hilar axillary lymphadenopathy. Lungs/Pleura: Evaluation the pulmonary parenchyma is degraded secondary to patient respiratory artifact. There is no CT correlate for questioned wedge-shaped opacity with the peripheral aspect of the left upper/mid lung. Minimal dependent subpleural ground-glass atelectasis. No discrete focal airspace opacities. No pleural effusion or pneumothorax. The central pulmonary airways appear widely patent. No discrete pulmonary nodules. Upper abdomen: Early arterial phase evaluation of the upper abdomen is normal. Musculoskeletal: No acute or aggressive osseous abnormalities. Regional soft tissues appear normal. Normal appearance of the thyroid gland. IMPRESSION: No acute cardiopulmonary disease. Specifically, no evidence of pulmonary embolism. The questioned wedge-shaped opacity within the peripheral aspect of the left upper/mid lung is without CT correlate and favored to be artifactual due to the confluence of overlying osseous and soft tissue structures. Electronically Signed   By: Simonne Come M.D.   On: 11/02/2015 00:28   TELE NSR  ECG NSR, minimal nonspecific mid-anterior St changes.  ASSESSMENT AND PLAN Very small NSTEMI, without significant regional wall motion abnormalities and with normal LVEF. Tiny troponin release (peak 0.9). S/P DES to proximal LAD (culprit) and distal RCA. Mandatory DAPT for 12 months. He is considering Twilight trial. Low risk, OK for early return to work (not a physical job). High dose atorvastatin. May need to add zetia if LDL>70 in 3 months. BP fairly low, I do not think he will do well with beta blocker. Early follow-up.   Thurmon Fair, MD, [facility] [phone] office [phone] pager 11/03/2015 8:51 AM

## 2015-11-04 ENCOUNTER — Other Ambulatory Visit: Payer: Self-pay | Admitting: *Deleted

## 2015-11-04 MED ORDER — AMBULATORY NON FORMULARY MEDICATION: 81.0000 mg | Freq: Every day | Status: DC

## 2015-11-04 MED ORDER — AMBULATORY NON FORMULARY MEDICATION: 90.0000 mg | Freq: Two times a day (BID) | Status: DC

## 2015-11-04 NOTE — Telephone Encounter (Signed)
LMTCB

## 2015-11-07 NOTE — Telephone Encounter (Signed)
lmtcb

## 2015-11-08 ENCOUNTER — Encounter: Payer: Self-pay | Admitting: Cardiology

## 2015-11-08 ENCOUNTER — Ambulatory Visit (INDEPENDENT_AMBULATORY_CARE_PROVIDER_SITE_OTHER): Payer: Self-pay | Admitting: Cardiology

## 2015-11-08 VITALS — BP 122/72 | HR 59 | Ht 70.0 in | Wt 159.4 lb

## 2015-11-08 DIAGNOSIS — E785 Hyperlipidemia, unspecified: Secondary | ICD-10-CM

## 2015-11-08 DIAGNOSIS — I214 Non-ST elevation (NSTEMI) myocardial infarction: Secondary | ICD-10-CM

## 2015-11-08 DIAGNOSIS — I200 Unstable angina: Secondary | ICD-10-CM

## 2015-11-08 DIAGNOSIS — Z79899 Other long term (current) drug therapy: Secondary | ICD-10-CM

## 2015-11-08 NOTE — Telephone Encounter (Signed)
Pt seen today by Robbie Lis PA

## 2015-11-08 NOTE — Addendum Note (Signed)
Addended by: Burnetta Sabin on: 11/08/2015 12:51 PM   Modules accepted: Orders

## 2015-11-08 NOTE — Progress Notes (Signed)
11/08/2015 Lance Boyer   1965-06-16  [id number]  Primary Physician Joycelyn Rua, MD Primary Cardiologist: Dr. Royann Shivers  Reason for Visit/CC: Post hospital follow-up for CAD, status post STEMI  HPI:  Patient is a 27 male with a past medical history significant for hyperlipidemia as well as a family history of CAD who was recently admitted to [facility] for unstable angina. He ruled in for non-ST elevation myocardial infarction. He underwent LHC with Dr. Eldridge Dace showing 90% lesion to the mid LAD and 75% lesion to prox RCA, both treated with DES. Plan to continue on DAPT for at least a year (ASA, Brilinta). 2D echo showed EF of 55-60% with normal WM.  He was enrolled in the twilight study (aspirin plus Brilinta). He was also placed on a high-dose statin, 80 mg of Lipitor. Lipid panel demonstrated an elevated LDL of 169 mg/dL. Hgb A1c was also checked and was normal at 5.4. He was also discharged home on a beta blocker, 12.5 mg Metroprolol twice a day.  He presents to clinic today for post hospital follow-up. He is accompanied by his wife. He reports that he's done well since discharge. He denies any anginal symptomatology. He denies any chest pain or dyspnea. He has been fully compliant with his medications. He has been tolerating these well. He denies any abnormal bleeding with aspirin and Brilinta. He is tolerating high-dose Lipitor well. No side effects.  EKG demonstrates sinus bradycardia with heart rate of 59 bpm. Blood pressure is well controlled 122/72.   Current Outpatient Prescriptions  Medication Sig Dispense Refill  . AMBULATORY NON FORMULARY MEDICATION Take 90 mg by mouth 2 (two) times daily. Medication Name: Brilinta 90 mg BID (TWILIGHT Research study provided)    . AMBULATORY NON FORMULARY MEDICATION Take 81 mg by mouth daily. Medication Name: TWILIGHT Study provided aspirin    . atorvastatin (LIPITOR) 80 MG tablet Take 1 tablet (80 mg total) by mouth daily at 6 PM. 30  tablet 11  . metoprolol tartrate (LOPRESSOR) 25 MG tablet Take 0.5 tablets (12.5 mg total) by mouth 2 (two) times daily. 60 tablet 11  . nitroGLYCERIN (NITROSTAT) 0.4 MG SL tablet Place 1 tablet (0.4 mg total) under the tongue every 5 (five) minutes x 3 doses as needed for chest pain. 25 tablet 3   No current facility-administered medications for this visit.     No Known Allergies  Social History   Social History  . Marital status: Married    Spouse name: N/A  . Number of children: N/A  . Years of education: N/A   Occupational History  . Not on file.   Social History Main Topics  . Smoking status: Never Smoker  . Smokeless tobacco: Never Used  . Alcohol use Yes     Comment: 11/02/2015 "might have a few drinks a few times/year"  . Drug use: No  . Sexual activity: Yes   Other Topics Concern  . Not on file   Social History Narrative  . No narrative on file     Review of Systems: General: negative for chills, fever, night sweats or weight changes.  Cardiovascular: negative for chest pain, dyspnea on exertion, edema, orthopnea, palpitations, paroxysmal nocturnal dyspnea or shortness of breath Dermatological: negative for rash Respiratory: negative for cough or wheezing Urologic: negative for hematuria Abdominal: negative for nausea, vomiting, diarrhea, bright red blood per rectum, melena, or hematemesis Neurologic: negative for visual changes, syncope, or dizziness All other systems reviewed and are otherwise negative except as  noted above.    Blood pressure 122/72, pulse (!) 59, height  (1.778 m), weight 159 lb 6.4 oz (72.3 kg).  General appearance: alert, cooperative and no distress Neck: no carotid bruit and no JVD Lungs: clear to auscultation bilaterally Heart: regular rate and rhythm, S1, S2 normal, no murmur, click, rub or gallop Extremities: no LEE Pulses: 2+ and symmetric Skin: warm and dry Neurologic: Grossly normal  EKG NSR. 59 bpm.  ASSESSMENT AND  PLAN:   1. CAD: Status post non-ST elevation myocardial infarction. Left heart cath 7/26 demonstrated 2 vessel obstructive CAD with a 90% mid LAD and 75% distal RCA both successfully treated with PCI plus drug-eluting stenting. He is stable without recurrent anginal symptomatology. He denies chest pain and dyspnea. He is on appropriate medical therapy with dual antiplatelet therapy, aspirin plus Brilinta, along with beta blocker + high intensity statin. We'll arrange for phase 2 cardiac rehabilitation.  2. Hyperlipidemia: Recent lipid panel showed severely elevated LDL level at 169 mg/dL. He is on high-dose statin therapy with Lipitor, 80 mg nightly. Recheck a fasting lipid panel plus hepatic function tests in 6 weeks. If LDL is not at goal of less than 70 mg/dL, will consider addition of Zetia vs change to Crestor vs PCSK9 inhibitor.   PLAN  F/u with Dr. Royann Shivers in 6-8 weeks.   [name] PA-C 11/08/2015 11:25 AM

## 2015-11-08 NOTE — Patient Instructions (Addendum)
Medication Instructions:  Your physician recommends that you continue on your current medications as directed. Please refer to the Current Medication list given to you today.   Labwork: 6 WEEKS:  Your physician recommends that you return for a FASTING lipid profile & HEPATIC  Testing/Procedures: None ordered  Follow-Up: Your physician recommends that you schedule a follow-up appointment in: 6 WEEKS WITH DR. Royann Shivers    Any Other Special Instructions Will Be Listed Below (If Applicable).     If you need a refill on your cardiac medications before your next appointment, please call your pharmacy.

## 2015-11-29 ENCOUNTER — Telehealth: Payer: Self-pay | Admitting: *Deleted

## 2015-11-29 NOTE — Telephone Encounter (Signed)
TWILIGHT Research study follow up call attempted. Left message for patient to call research office.

## 2015-12-01 ENCOUNTER — Telehealth: Payer: Self-pay | Admitting: *Deleted

## 2015-12-01 NOTE — Telephone Encounter (Signed)
Left message for patient to call the research office to let me know if he is doing okay on Brilinta and ASA. (No adverse events). Also he needs to schedule an appointment with research for his 3 month visit. His window for month 3 is 01/17/16-02/14/16 and at that appointment he will receive more brilnta and be randomized to ASA 81 mg or Placebo.

## 2015-12-08 ENCOUNTER — Encounter: Payer: Self-pay | Admitting: *Deleted

## 2015-12-08 ENCOUNTER — Encounter: Payer: Self-pay | Admitting: Cardiology

## 2015-12-08 DIAGNOSIS — Z006 Encounter for examination for normal comparison and control in clinical research program: Secondary | ICD-10-CM

## 2015-12-08 NOTE — Progress Notes (Signed)
TWILIGHT Research month 1 telephone follow up completed. Patient tolerating Brilinta well states he has not missed any doses and no adverse events. Instructed patients to return call to schedule 3 month randomization appointment around the end of October first of November.

## 2015-12-19 ENCOUNTER — Other Ambulatory Visit: Payer: Managed Care, Other (non HMO) | Admitting: *Deleted

## 2015-12-19 DIAGNOSIS — Z79899 Other long term (current) drug therapy: Secondary | ICD-10-CM

## 2015-12-19 DIAGNOSIS — E785 Hyperlipidemia, unspecified: Secondary | ICD-10-CM

## 2015-12-19 LAB — HEPATIC FUNCTION PANEL
ALBUMIN: 4.5 g/dL (ref 3.6–5.1)
ALT: 35 U/L (ref 9–46)
AST: 24 U/L (ref 10–40)
Alkaline Phosphatase: 70 U/L (ref 40–115)
Bilirubin, Direct: 0.2 mg/dL (ref <=0.2)
Indirect Bilirubin: 0.5 mg/dL (ref 0.2–1.2)
TOTAL PROTEIN: 6.8 g/dL (ref 6.1–8.1)
Total Bilirubin: 0.7 mg/dL (ref 0.2–1.2)

## 2015-12-19 LAB — LIPID PANEL
Cholesterol: 145 mg/dL (ref 125–200)
HDL: 77 mg/dL (ref >=40)
LDL CALC: 55 mg/dL (ref <130)
TRIGLYCERIDES: 63 mg/dL (ref <150)
Total CHOL/HDL Ratio: 1.9 Ratio (ref <=5.0)
VLDL: 13 mg/dL (ref <30)

## 2015-12-21 ENCOUNTER — Ambulatory Visit (INDEPENDENT_AMBULATORY_CARE_PROVIDER_SITE_OTHER): Payer: Managed Care, Other (non HMO) | Admitting: Cardiovascular Disease

## 2015-12-21 ENCOUNTER — Encounter: Payer: Self-pay | Admitting: Cardiovascular Disease

## 2015-12-21 VITALS — BP 104/67 | HR 57 | Ht 70.0 in | Wt 164.2 lb

## 2015-12-21 DIAGNOSIS — E785 Hyperlipidemia, unspecified: Secondary | ICD-10-CM | POA: Diagnosis not present

## 2015-12-21 DIAGNOSIS — I2510 Atherosclerotic heart disease of native coronary artery without angina pectoris: Secondary | ICD-10-CM | POA: Diagnosis not present

## 2015-12-21 NOTE — Patient Instructions (Signed)
Dr Royann Shivers recommends that you schedule a follow-up appointment in JULY 2018. You will receive a reminder letter in the mail two months in advance. If you don't receive a letter, please call our office to schedule the follow-up appointment.  If you need a refill on your cardiac medications before your next appointment, please call your pharmacy.

## 2015-12-21 NOTE — Progress Notes (Signed)
Cardiology Office Note    Date:  12/22/2015   ID:  Lance Boyer, DOB Jun 27, 1965, MRN [id number]  PCP:  Joycelyn Rua, MD  Cardiologist:   Thurmon Fair, MD   Chief Complaint  Patient presents with  . Follow-up    6 weeks; Pt states no Sx or concerns.    History of Present Illness:  Lance Boyer is a 50 y.o. male with early-onset coronary artery disease who received 2 drug-eluting stents in July 2017 following a non-ST segment elevation myocardial infarction (mid LAD Synergy 3x28 and proximal PDA Synergy 3.5 x 24). Since then he has had one relatively brief episode of chest discomfort when he was in [place]. It occurred during light activity and resolve spontaneously. He did not take nitroglycerin. He has now returned to exercises in the gym moderate intensity and has not had recurrent angina pectoris or any complaints of shortness of breath. He has not had bleeding problems. He is enrolled in the twilight study for early discontinuation of aspirin with Brilinta. Prior to his myocardial infarction he was a very athletic, physically fit individual. He is not enrolled in cardiac rehabilitation, but is conscientiously exercising on his own. He finds the effects of metoprolol beneficial, keeping his heart rate slower during exercise at the gym. He has described occasional orthostatic dizziness but this is mild.  Diagnostic Diagram     Post-Intervention Diagram        Past Medical History:  Diagnosis Date  . Coronary artery disease    a. PCI 11/02/2015 90% mid LAD s/p DES, 75% Prox RCA s/p DES  . GERD (gastroesophageal reflux disease)   . High cholesterol 10/08/2014  . Myocardial infarction (HCC) 11/01/2015    Past Surgical History:  Procedure Laterality Date  . CARDIAC CATHETERIZATION N/A 11/02/2015   Procedure: Left Heart Cath and Coronary Angiography;  Surgeon: Corky Crafts, MD;  Location: [facility] INVASIVE CV LAB;  Service: Cardiovascular;  Laterality: N/A;  . CARDIAC  CATHETERIZATION N/A 11/02/2015   Procedure: Coronary Stent Intervention;  Surgeon: Corky Crafts, MD;  Location: [facility] INVASIVE CV LAB;  Service: Cardiovascular;  Laterality: N/A;    Current Medications: Outpatient Medications Prior to Visit  Medication Sig Dispense Refill  . AMBULATORY NON FORMULARY MEDICATION Take 90 mg by mouth 2 (two) times daily. Medication Name: Brilinta 90 mg BID (TWILIGHT Research study provided)    . AMBULATORY NON FORMULARY MEDICATION Take 81 mg by mouth daily. Medication Name: TWILIGHT Study provided aspirin    . atorvastatin (LIPITOR) 80 MG tablet Take 1 tablet (80 mg total) by mouth daily at 6 PM. 30 tablet 11  . metoprolol tartrate (LOPRESSOR) 25 MG tablet Take 0.5 tablets (12.5 mg total) by mouth 2 (two) times daily. 60 tablet 11  . nitroGLYCERIN (NITROSTAT) 0.4 MG SL tablet Place 1 tablet (0.4 mg total) under the tongue every 5 (five) minutes x 3 doses as needed for chest pain. 25 tablet 3   No facility-administered medications prior to visit.      Allergies:   Review of patient's allergies indicates no known allergies.   Social History   Social History  . Marital status: Married    Spouse name: N/A  . Number of children: N/A  . Years of education: N/A   Social History Main Topics  . Smoking status: Never Smoker  . Smokeless tobacco: Never Used  . Alcohol use Yes     Comment: 11/02/2015 "might have a few drinks a few times/year"  . Drug use:  No  . Sexual activity: Yes   Other Topics Concern  . None   Social History Narrative  . None     Family History:  The patient's Father had early onset coronary artery disease in his 240s  ROS:   Please see the history of present illness.    ROS All other systems reviewed and are negative.   PHYSICAL EXAM:   VS:  BP 104/67   Pulse (!) 57   Ht 5\' 10"  (1.778 m)   Wt 164 lb 3.2 oz (74.5 kg)   BMI 23.56 kg/m    GEN: Well nourished, well developed, in no acute distress  HEENT: normal  Neck: no  JVD, carotid bruits, or masses Cardiac: RRR; no murmurs, rubs, or gallops,no edema  Respiratory:  clear to auscultation bilaterally, normal work of breathing GI: soft, nontender, nondistended, + BS MS: no deformity or atrophy  Skin: warm and dry, no rash Neuro:  Alert and Oriented x 3, Strength and sensation are intact Psych: euthymic mood, full affect  Wt Readings from Last 3 Encounters:  12/21/15 164 lb 3.2 oz (74.5 kg)  11/08/15 159 lb 6.4 oz (72.3 kg)  11/03/15 160 lb 15 oz (73 kg)      Studies/Labs Reviewed:   EKG:  EKG is not ordered today.    Recent Labs: 11/02/2015: B Natriuretic Peptide 32.8; Magnesium 2.5 11/03/2015: BUN 15; Creatinine, Ser 1.09; Hemoglobin 12.7; Platelets 213; Potassium 4.1; Sodium 137 12/19/2015: ALT 35   Lipid Panel    Component Value Date/Time   CHOL 145 12/19/2015 0843   TRIG 63 12/19/2015 0843   HDL 77 12/19/2015 0843   CHOLHDL 1.9 12/19/2015 0843   VLDL 13 12/19/2015 0843   LDLCALC 55 12/19/2015 0843     ASSESSMENT:    1. Coronary artery disease involving native coronary artery of native heart without angina pectoris   2. Hyperlipemia      PLAN:  In order of problems listed above:  1. CAD s/p NSTEMI, s/p DES x2 July 2017: Continue antiplatelet therapy, statin and low-dose beta blocker. Discussed small but true risk of restenosis within the 6-18 months following the initial procedure. 2. HLP: Exceptional improvement in lipid profile with a greater than 70% reduction in LDL cholesterol.  Continue gradually increasing level of physical activity. Plan to see him at his 12 month follow-up after the procedure. He is to call sooner if he develops any exertional symptoms.    Medication Adjustments/Labs and Tests Ordered: Current medicines are reviewed at length with the patient today.  Concerns regarding medicines are outlined above.  Medication changes, Labs and Tests ordered today are listed in the Patient Instructions below. Patient  Instructions  Dr Royann Shiversroitoru recommends that you schedule a follow-up appointment in JULY 2018. You will receive a reminder letter in the mail two months in advance. If you don't receive a letter, please call our office to schedule the follow-up appointment.  If you need a refill on your cardiac medications before your next appointment, please call your pharmacy.    Signed, Thurmon FairMihai Abbrielle Batts, MD  12/22/2015 8:37 AM    [facility] [address], [place], [place]  [id number] Phone: [phone]; Fax: [phone]

## 2015-12-22 DIAGNOSIS — Z9861 Coronary angioplasty status: Secondary | ICD-10-CM

## 2015-12-22 DIAGNOSIS — I2510 Atherosclerotic heart disease of native coronary artery without angina pectoris: Secondary | ICD-10-CM | POA: Insufficient documentation

## 2016-01-17 ENCOUNTER — Telehealth: Payer: Self-pay | Admitting: Cardiovascular Disease

## 2016-01-17 ENCOUNTER — Other Ambulatory Visit: Payer: Self-pay | Admitting: *Deleted

## 2016-01-17 DIAGNOSIS — R079 Chest pain, unspecified: Secondary | ICD-10-CM

## 2016-01-17 NOTE — Telephone Encounter (Signed)
Pt have been experiencing shortness of breath and a little chest pain for a week. He was told by Dr C that when this happen to call the office.Pt is not having this at the moment.

## 2016-01-17 NOTE — Telephone Encounter (Signed)
Pt notified of recommendations. He is aware I will submit test order and a scheduler will reach out to him to arrange. Pt voiced thanks.

## 2016-01-17 NOTE — Telephone Encounter (Signed)
Please go ahead and schedule him for a treadmill Myoview.

## 2016-01-17 NOTE — Telephone Encounter (Signed)
Pt of Dr. Royann Shivers. Notes hx of NSTEMI w 2 DES placed in July this year.  Patient notes when he was last seen a few weeks ago, mentioned at the time that he'd had chest tightness/a little pain while in [place], had taken 2x NTGs for resolution of symptoms. Was instructed to notify us again if this happens.  Patient notes he called today because he was in [place] this week -- one night he had a little SOB, pain in sternum "not a lot, wasn't severe, but enough that i was nervous", took 1xNTG and then additional  approx 15 mins later. Discomfort completely resolved with this.  Today and sporadically every day, gets a little chest tightness and twinge of pain - "nothing that brings tears to my eyes" but enough to notice.  Describes CP as "annoyingly persistent". Doesn't want to keep worrying "worrying makes it worse".  Just wanted to let us know and see if anything he could be doing differently. Acknowledges he is taking Brilinta and other meds as prescribed, denies missing any doses.  Pt aware I will return call with physician's recommendations. Advised if his pain lasts longer than 20 mins or is not resolved w/ 3rd dose of NTG to call EMS/go to ED.

## 2016-01-17 NOTE — Telephone Encounter (Signed)
Order submitted and request sent to scheduling. Patient aware to call if new concerns or questions.

## 2016-02-07 ENCOUNTER — Encounter: Payer: Self-pay | Admitting: *Deleted

## 2016-02-07 ENCOUNTER — Other Ambulatory Visit: Payer: Self-pay | Admitting: *Deleted

## 2016-02-07 DIAGNOSIS — Z006 Encounter for examination for normal comparison and control in clinical research program: Secondary | ICD-10-CM

## 2016-02-07 MED ORDER — AMBULATORY NON FORMULARY MEDICATION: 81.0000 mg | Freq: Every day | Status: DC

## 2016-02-07 NOTE — Progress Notes (Signed)
TWILIGHT Research study month 3 randomization visit completed. Patient denies any bleeding or other adverse events. Today he is randomized to ASA 81 mg Daily or PLACEBO as part of the TWILIGHT Research protocol. He states he has been compliant with research provided medication. He briefly mentioned some "chest twinges" that he called the cardiology office about and it was recommended that he have a stress test but has not heard back from office to schedule. He states he has not felt any of that since August. I have encouraged him to follow up with that today and go ahead and see if office will schedule stress testing. Patient verbalized understanding. Questions encouraged and answered. Next research study phone visit will be no later than 03/08/16.

## 2016-02-27 ENCOUNTER — Telehealth: Payer: Self-pay | Admitting: *Deleted

## 2016-02-27 NOTE — Telephone Encounter (Signed)
Left message for patient to call research office for month 4 telephone follow up for the TWILIGHT Research study.

## 2016-03-05 ENCOUNTER — Telehealth: Payer: Self-pay | Admitting: *Deleted

## 2016-03-05 NOTE — Telephone Encounter (Signed)
Left message for patient to call research office for month 4 follow up for the TWILIGHT Research study

## 2016-03-07 ENCOUNTER — Telehealth: Payer: Self-pay | Admitting: *Deleted

## 2016-03-07 NOTE — Telephone Encounter (Signed)
Left message for patient to call the research office for TWILIGHT Research study month 4 telephone follow up.

## 2016-03-12 ENCOUNTER — Encounter: Payer: Self-pay | Admitting: *Deleted

## 2016-03-12 DIAGNOSIS — Z006 Encounter for examination for normal comparison and control in clinical research program: Secondary | ICD-10-CM

## 2016-03-12 NOTE — Progress Notes (Signed)
TWILIGHT Research Study month 4 telephone follow up completed. Patient states he has not had any bleeding events or other adverse events. He also states he has been compliant with medication. Next study related visit is due no later than 22/MAY/2018. Questions encouraged and answered.

## 2016-05-04 ENCOUNTER — Encounter: Payer: Self-pay | Admitting: Cardiology

## 2016-05-04 ENCOUNTER — Ambulatory Visit (INDEPENDENT_AMBULATORY_CARE_PROVIDER_SITE_OTHER): Payer: Managed Care, Other (non HMO) | Admitting: Cardiology

## 2016-05-04 VITALS — BP 105/64 | HR 62 | Ht 70.0 in | Wt 168.4 lb

## 2016-05-04 DIAGNOSIS — I2510 Atherosclerotic heart disease of native coronary artery without angina pectoris: Secondary | ICD-10-CM | POA: Diagnosis not present

## 2016-05-04 DIAGNOSIS — E785 Hyperlipidemia, unspecified: Secondary | ICD-10-CM | POA: Diagnosis not present

## 2016-05-04 DIAGNOSIS — R079 Chest pain, unspecified: Secondary | ICD-10-CM

## 2016-05-04 DIAGNOSIS — I252 Old myocardial infarction: Secondary | ICD-10-CM | POA: Diagnosis not present

## 2016-05-04 DIAGNOSIS — Z9861 Coronary angioplasty status: Secondary | ICD-10-CM

## 2016-05-04 DIAGNOSIS — R9431 Abnormal electrocardiogram [ECG] [EKG]: Secondary | ICD-10-CM

## 2016-05-04 NOTE — Assessment & Plan Note (Signed)
Pt noted mid sternal chest pain recently while in [place]

## 2016-05-04 NOTE — Progress Notes (Signed)
05/04/2016 Lance Boyer   1965/09/25  [id number]  Primary Physician Joycelyn Rua, MD Primary Cardiologist: Dr Royann Shivers  HPI:  51 y.o. male with early-onset coronary artery disease who received 2 drug-eluting stents in July 2017 following a non-ST segment elevation myocardial infarction (p LAD Synergy 3x28 and d PDA Synergy 3.5 x 24).He tolerated this well. LVF was normal. He was enrolled in the [place] study. He is in the office today with complaints of recent chest pain. The pt says while he was in [place] recently he noted some vague chest "tightness". It did not radiate. It did not make him stop what he was doing. He tells me he has not been exercising but has not noted any reproducible exertional chest pain. He admits he has had chest discomfort off and on since his MI, Dr Royann Shivers had recommended a Myoview in Oct when he had similar symptoms. The pt did take NTG for this in the past and felt like it did help. His symptoms are not exertional. He also says he recently stopped his Lopressor 12.5 mg BID and noted tachycardia and palpitations- "HR 120".     Current Outpatient Prescriptions  Medication Sig Dispense Refill  . AMBULATORY NON FORMULARY MEDICATION Take 90 mg by mouth 2 (two) times daily. Medication Name: Brilinta 90 mg BID (TWILIGHT Research study provided)    . AMBULATORY NON FORMULARY MEDICATION Take 81 mg by mouth daily. Medication Name: Aspirin 81 mg daily or PLACEBO (TWILIGHT Research study)    . atorvastatin (LIPITOR) 80 MG tablet Take 1 tablet (80 mg total) by mouth daily at 6 PM. 30 tablet 11  . metoprolol tartrate (LOPRESSOR) 25 MG tablet Take 0.5 tablets (12.5 mg total) by mouth 2 (two) times daily. 60 tablet 11  . nitroGLYCERIN (NITROSTAT) 0.4 MG SL tablet Place 1 tablet (0.4 mg total) under the tongue every 5 (five) minutes x 3 doses as needed for chest pain. 25 tablet 3   No current facility-administered medications for this visit.     No Known Allergies  Social  History   Social History  . Marital status: Married    Spouse name: N/A  . Number of children: N/A  . Years of education: N/A   Occupational History  . Not on file.   Social History Main Topics  . Smoking status: Never Smoker  . Smokeless tobacco: Never Used  . Alcohol use Yes     Comment: 11/02/2015 "might have a few drinks a few times/year"  . Drug use: No  . Sexual activity: Yes   Other Topics Concern  . Not on file   Social History Narrative  . No narrative on file     Review of Systems: General: negative for chills, fever, night sweats or weight changes.  Cardiovascular: negative for chest pain, dyspnea on exertion, edema, orthopnea, palpitations, paroxysmal nocturnal dyspnea or shortness of breath Dermatological: negative for rash Respiratory: negative for cough or wheezing Urologic: negative for hematuria Abdominal: negative for nausea, vomiting, diarrhea, bright red blood per rectum, melena, or hematemesis Neurologic: negative for visual changes, syncope, or dizziness All other systems reviewed and are otherwise negative except as noted above.    Blood pressure 105/64, pulse 62, height 5\' 10"  (1.778 m), weight 168 lb 6.4 oz (76.4 kg).  General appearance: alert, cooperative and no distress Neck: no carotid bruit and no JVD Lungs: clear to auscultation bilaterally Heart: regular rate and rhythm Extremities: extremities normal, atraumatic, no cyanosis or edema Skin: Skin color, texture, turgor  normal. No rashes or lesions Neurologic: Grossly normal  EKG NSR, TWI 3 and AVF which appears to be new  ASSESSMENT AND PLAN:   Chest pain with moderate risk of acute coronary syndrome Pt noted mid sternal chest pain recently while in [place]  CAD S/P percutaneous coronary angioplasty CAD- s/p pLAD and dRCA PCI with DES July 2017  Dyslipidemia LDL went from 169 to 54 (Sept 2017) on Lipitor 80 mg.  Abnormal EKG New inferior TWI   PLAN  Reviewed EKG and discussed  with Dr Royann Shivers in the office. Plan an exercise Myoview, hold beta blocker for 24 hrs pre stress. The pt does have SL NTG if needed, F/U Dr Royann Shivers in 6 weeks if stress test negative.   Corine Shelter PA-C 05/04/2016 10:15 AM

## 2016-05-04 NOTE — Assessment & Plan Note (Signed)
New inferior TWI

## 2016-05-04 NOTE — Assessment & Plan Note (Signed)
CAD- s/p pLAD and dRCA PCI with DES July 2017 

## 2016-05-04 NOTE — Assessment & Plan Note (Signed)
LDL went from 169 to 54 (Sept 2017) on Lipitor 80 mg.

## 2016-05-04 NOTE — Patient Instructions (Signed)
Medication Instructions:  Your physician recommends that you continue on your current medications as directed. Please refer to the Current Medication list given to you today   If you need a refill on your cardiac medications before your next appointment, please call your pharmacy.  Testing/Procedures: Your physician has requested that you have en exercise stress myoview. For further information please visit [email or web address] Please follow instruction sheet, as given.  Follow-Up: Your physician recommends that you schedule a follow-up appointment in: 6 WEEKS WITH DR GYJEHUDJ   Special Instructions: HOLD METOPROLOL THE DAY BEFORE TESTING   Thank you for choosing [facility]    Marcelino Duster, LPN Corine Shelter, PA-C

## 2016-05-08 ENCOUNTER — Telehealth (HOSPITAL_COMMUNITY): Payer: Self-pay

## 2016-05-08 NOTE — Telephone Encounter (Signed)
Encounter complete. 

## 2016-05-09 ENCOUNTER — Encounter (HOSPITAL_COMMUNITY): Payer: Managed Care, Other (non HMO)

## 2016-05-10 ENCOUNTER — Ambulatory Visit (HOSPITAL_COMMUNITY)
Admission: RE | Admit: 2016-05-10 | Discharge: 2016-05-10 | Disposition: A | Discharge status: Home / Self Care | Payer: Managed Care, Other (non HMO) | Source: Ambulatory Visit | Attending: Cardiovascular Disease | Admitting: Cardiovascular Disease

## 2016-05-10 DIAGNOSIS — I2510 Atherosclerotic heart disease of native coronary artery without angina pectoris: Secondary | ICD-10-CM | POA: Diagnosis not present

## 2016-05-10 DIAGNOSIS — Z8249 Family history of ischemic heart disease and other diseases of the circulatory system: Secondary | ICD-10-CM | POA: Insufficient documentation

## 2016-05-10 DIAGNOSIS — R079 Chest pain, unspecified: Secondary | ICD-10-CM | POA: Insufficient documentation

## 2016-05-10 DIAGNOSIS — I214 Non-ST elevation (NSTEMI) myocardial infarction: Secondary | ICD-10-CM | POA: Diagnosis not present

## 2016-05-10 LAB — MYOCARDIAL PERFUSION IMAGING
Estimated workload: 14.9 METS
Exercise duration (min): 12 min
Exercise duration (sec): 50 s
LV dias vol: 79 mL (ref 62–150)
LV sys vol: 30 mL
MPHR: 171 {beats}/min
Peak HR: 181 {beats}/min
Percent HR: 105 %
RPE: 17
Rest HR: 66 {beats}/min
SDS: 1
SRS: 2
SSS: 3
TID: 1.51

## 2016-05-10 MED ORDER — TECHNETIUM TC 99M TETROFOSMIN IV KIT
10.1000 | PACK | Freq: Once | INTRAVENOUS | Status: AC | PRN
  Administered 2016-05-10: 10.1 via INTRAVENOUS
  Filled 2016-05-10: qty 11

## 2016-05-10 MED ORDER — TECHNETIUM TC 99M TETROFOSMIN IV KIT
31.0000 | PACK | Freq: Once | INTRAVENOUS | Status: AC | PRN
  Administered 2016-05-10: 31 via INTRAVENOUS
  Filled 2016-05-10: qty 31

## 2016-05-16 ENCOUNTER — Encounter: Payer: Self-pay | Admitting: *Deleted

## 2016-06-04 NOTE — Progress Notes (Signed)
Cardiology Office Note    Date:  06/05/2016   ID:  Lance Boyer, DOB Mar 21, 1966, MRN [id number]  PCP:  Joycelyn Rua, MD  Cardiologist:   Thurmon Fair, MD   Chief Complaint  Patient presents with  . Follow-up    pt reports no complaints    History of Present Illness:  Lance Boyer is a 51 y.o. male with early-onset coronary artery disease who received 2 drug-eluting stents in July 2017 following a non-ST segment elevation myocardial infarction (mid LAD Synergy 3x28 and proximal PDA Synergy 3.5 x 24). Enrolled in the TWILIGHT study.  In January he had some chest discomfort at rest during a trip to [place]. He does not have chest pain with exercise. Earlier this month he had a normal nuclear stress test (9 minutes, had mild ST changes but normal images, EF 62%).  HeWrites his stationary bike twice a week maintaining a heart rate in the 130s, without recurrent angina pectoris or any complaints of shortness of breath. He has not had bleeding problems. He is enrolled in the TWILIGHT study for early discontinuation of aspirin with Brilinta. Prior to his myocardial infarction he was a very athletic, physically fit individual. He has not returned to riding a mountain bike. He discontinued metoprolol due to orthostatic dizziness.  Diagnostic Diagram     Post-Intervention Diagram        Past Medical History:  Diagnosis Date  . Coronary artery disease    a. PCI 11/02/2015 90% mid LAD s/p DES, 75% Prox RCA s/p DES  . GERD (gastroesophageal reflux disease)   . High cholesterol 10/08/2014  . Myocardial infarction 11/01/2015    Past Surgical History:  Procedure Laterality Date  . CARDIAC CATHETERIZATION N/A 11/02/2015   Procedure: Left Heart Cath and Coronary Angiography;  Surgeon: Corky Crafts, MD;  Location: [facility] INVASIVE CV LAB;  Service: Cardiovascular;  Laterality: N/A;  . CARDIAC CATHETERIZATION N/A 11/02/2015   Procedure: Coronary Stent Intervention;  Surgeon: Corky Crafts, MD;  Location: [facility] INVASIVE CV LAB;  Service: Cardiovascular;  Laterality: N/A;    Current Medications: Outpatient Medications Prior to Visit  Medication Sig Dispense Refill  . AMBULATORY NON FORMULARY MEDICATION Take 90 mg by mouth 2 (two) times daily. Medication Name: Brilinta 90 mg BID (TWILIGHT Research study provided)    . AMBULATORY NON FORMULARY MEDICATION Take 81 mg by mouth daily. Medication Name: Aspirin 81 mg daily or PLACEBO (TWILIGHT Research study)    . atorvastatin (LIPITOR) 80 MG tablet Take 1 tablet (80 mg total) by mouth daily at 6 PM. 30 tablet 11  . metoprolol tartrate (LOPRESSOR) 25 MG tablet Take 0.5 tablets (12.5 mg total) by mouth 2 (two) times daily. 60 tablet 11  . nitroGLYCERIN (NITROSTAT) 0.4 MG SL tablet Place 1 tablet (0.4 mg total) under the tongue every 5 (five) minutes x 3 doses as needed for chest pain. 25 tablet 3   No facility-administered medications prior to visit.      Allergies:   Patient has no known allergies.   Social History   Social History  . Marital status: Married    Spouse name: N/A  . Number of children: N/A  . Years of education: N/A   Social History Main Topics  . Smoking status: Never Smoker  . Smokeless tobacco: Never Used  . Alcohol use Yes     Comment: 11/02/2015 "might have a few drinks a few times/year"  . Drug use: No  . Sexual activity: Yes  Other Topics Concern  . None   Social History Narrative  . None     Family History:  The patient's Father had early onset coronary artery disease in his 83s  ROS:   Please see the history of present illness.    ROS All other systems reviewed and are negative.   PHYSICAL EXAM:   VS:  BP 125/84   Pulse 73   Ht 5\' 10"  (1.778 m)   Wt 77.4 kg (170 lb 9.6 oz)   SpO2 98%   BMI 24.48 kg/m    GEN: Well nourished, well developed, in no acute distress  HEENT: normal  Neck: no JVD, carotid bruits, or masses Cardiac: RRR; no murmurs, rubs, or gallops,no edema    Respiratory:  clear to auscultation bilaterally, normal work of breathing GI: soft, nontender, nondistended, + BS MS: no deformity or atrophy  Skin: warm and dry, no rash Neuro:  Alert and Oriented x 3, Strength and sensation are intact Psych: euthymic mood, full affect  Wt Readings from Last 3 Encounters:  06/05/16 77.4 kg (170 lb 9.6 oz)  05/10/16 76.2 kg (168 lb)  05/04/16 76.4 kg (168 lb 6.4 oz)      Studies/Labs Reviewed:   EKG:  EKG is not ordered today.    Recent Labs: 11/02/2015: B Natriuretic Peptide 32.8; Magnesium 2.5 11/03/2015: BUN 15; Creatinine, Ser 1.09; Hemoglobin 12.7; Platelets 213; Potassium 4.1; Sodium 137 12/19/2015: ALT 35   Lipid Panel    Component Value Date/Time   CHOL 145 12/19/2015 0843   TRIG 63 12/19/2015 0843   HDL 77 12/19/2015 0843   CHOLHDL 1.9 12/19/2015 0843   VLDL 13 12/19/2015 0843   LDLCALC 55 12/19/2015 0843     ASSESSMENT:    1. CAD S/P percutaneous coronary angioplasty   2. Dyslipidemia      PLAN:  In order of problems listed above:  1. CAD s/p NSTEMI, s/p DES x2 July 2017: Continue antiplatelet therapy, statin. Normal stress perfusion study February 2018. 2. HLP: Exceptional improvement in lipid profile with a greater than 70% reduction in LDL cholesterol.  Continue gradually increasing frequency of physical activity. He has gained weight since his myocardial infarction due to less physical activity and should not gain any more weight to avoid reaching overweight status. Plan to see him at his 12 month follow-up after the procedure. He is to call sooner if he develops any exertional symptoms.    Medication Adjustments/Labs and Tests Ordered: Current medicines are reviewed at length with the patient today.  Concerns regarding medicines are outlined above.  Medication changes, Labs and Tests ordered today are listed in the Patient Instructions below. Patient Instructions  Dr Royann Shivers recommends that you schedule a  follow-up appointment in 6 months. You will receive a reminder letter in the mail two months in advance. If you don't receive a letter, please call our office to schedule the follow-up appointment.  If you need a refill on your cardiac medications before your next appointment, please call your pharmacy.    Signed, Thurmon Fair, MD  06/05/2016 1:05 PM    [facility] [address] [place], [place], [place]  [address] Phone: [phone]; Fax: [phone]

## 2016-06-05 ENCOUNTER — Ambulatory Visit (INDEPENDENT_AMBULATORY_CARE_PROVIDER_SITE_OTHER): Payer: Managed Care, Other (non HMO) | Admitting: Cardiovascular Disease

## 2016-06-05 ENCOUNTER — Encounter: Payer: Self-pay | Admitting: Cardiovascular Disease

## 2016-06-05 VITALS — BP 125/84 | HR 73 | Ht 70.0 in | Wt 170.6 lb

## 2016-06-05 DIAGNOSIS — I2510 Atherosclerotic heart disease of native coronary artery without angina pectoris: Secondary | ICD-10-CM

## 2016-06-05 DIAGNOSIS — Z9861 Coronary angioplasty status: Secondary | ICD-10-CM

## 2016-06-05 DIAGNOSIS — E785 Hyperlipidemia, unspecified: Secondary | ICD-10-CM | POA: Diagnosis not present

## 2016-06-05 NOTE — Patient Instructions (Signed)
Dr Croitoru recommends that you schedule a follow-up appointment in 6 months. You will receive a reminder letter in the mail two months in advance. If you don't receive a letter, please call our office to schedule the follow-up appointment.  If you need a refill on your cardiac medications before your next appointment, please call your pharmacy. 

## 2016-06-27 ENCOUNTER — Ambulatory Visit: Payer: Managed Care, Other (non HMO) | Admitting: Cardiology

## 2016-08-20 ENCOUNTER — Encounter: Payer: Self-pay | Admitting: *Deleted

## 2016-08-20 DIAGNOSIS — Z006 Encounter for examination for normal comparison and control in clinical research program: Secondary | ICD-10-CM

## 2016-08-20 NOTE — Progress Notes (Signed)
TWILIGHT Research study month 9 follow up visit completed. Patient denies any bleed events or other adverse events. He has been 99% compliant with ASA/Placebo and 98 % compliant with Brilinta. His next research required visit is due no later than 18/NOV/2018. At that visit he will no longer receive ASA/Placebo or Brilinta. Further antiplatelet therapy will be at the discretion of his cardiologist. Question were encouraged and answered.

## 2016-11-07 ENCOUNTER — Other Ambulatory Visit: Payer: Self-pay | Admitting: Cardiology

## 2016-11-07 ENCOUNTER — Telehealth: Payer: Self-pay | Admitting: Cardiovascular Disease

## 2016-11-07 MED ORDER — ATORVASTATIN CALCIUM 80 MG PO TABS: ORAL_TABLET | ORAL | 0 refills | Status: DC

## 2016-11-07 NOTE — Telephone Encounter (Signed)
This is Dr. Croitoru's pt 

## 2016-11-07 NOTE — Telephone Encounter (Signed)
Patient calling, states that he would like his prescription for atorvastatin 80 mg sent to a mail order service for 90 days.   How does patient go about switching to mail order?

## 2016-12-27 ENCOUNTER — Telehealth: Payer: Self-pay

## 2016-12-27 NOTE — Telephone Encounter (Signed)
Left detailed message on name-verified voicemail (okay per DPR).

## 2016-12-27 NOTE — Telephone Encounter (Signed)
-----   Message from Thurmon Fair, MD sent at 12/26/2016  3:56 PM EDT ----- Regarding: RE: End of TWILIGHT study [place], when he finishes the trial medication, please have him continue open label aspirin 81 mg daily MCr ----- Message ----- From: Elaina Pattee, RN Sent: 12/26/2016   3:31 PM To: Thurmon Fair, MD Subject: End of TWILIGHT study                          Pt is coming in for his end of TWILIGHT treatment 01/04/17. He will no longer receive ASA/pacebo or Brilinta, further antiplatelet will be at your discretion including and ASA. If applicable a scrip will need to be sent to his pharmacy.  Thanks Bergoo :)

## 2017-01-04 ENCOUNTER — Other Ambulatory Visit: Payer: Self-pay | Admitting: *Deleted

## 2017-01-04 ENCOUNTER — Encounter: Payer: Self-pay | Admitting: *Deleted

## 2017-01-04 DIAGNOSIS — Z006 Encounter for examination for normal comparison and control in clinical research program: Secondary | ICD-10-CM

## 2017-01-04 MED ORDER — ASPIRIN EC 81 MG PO TBEC: 81.0000 mg | DELAYED_RELEASE_TABLET | Freq: Every day | ORAL | 3 refills | Status: AC

## 2017-01-04 NOTE — Progress Notes (Addendum)
TWILIGHT Research study month 15 follow up visit completed. Patient denies any bleeding events or other adverse events. After pill count patient was 91 % compliant with ASA/Pacebo and  93 % compliant with Brilinta.He was instructed to start ASA 81 mg daily. He has 1 last research telephone follow up visit to be completed around 04/11/17. I thanked patient for his participation in the research study and explained the results that will be either mailed to him or a phone call.

## 2017-04-04 ENCOUNTER — Other Ambulatory Visit: Payer: Self-pay | Admitting: Cardiovascular Disease

## 2017-05-01 ENCOUNTER — Telehealth: Payer: Self-pay | Admitting: *Deleted

## 2017-05-01 NOTE — Telephone Encounter (Signed)
Left message for patient to call research office for last telephone follow up for the TWILIGHT research study.

## 2017-05-07 ENCOUNTER — Telehealth: Payer: Self-pay | Admitting: *Deleted

## 2017-05-07 NOTE — Telephone Encounter (Signed)
TWILIGHT Research study month 18 follow up call attempted. Left message for patient to call office. 

## 2017-05-09 ENCOUNTER — Encounter: Payer: Self-pay | Admitting: *Deleted

## 2017-05-09 DIAGNOSIS — Z006 Encounter for examination for normal comparison and control in clinical research program: Secondary | ICD-10-CM

## 2017-05-10 NOTE — Progress Notes (Signed)
TWILIGHT Research study month 18 telephone follow up completed. Patient continues to take ASA 81 mg daily without any adverse bleeding events. This concludes the TWILIGHT follow up. I thanked him for his participation in the study.

## 2017-07-01 ENCOUNTER — Other Ambulatory Visit: Payer: Self-pay | Admitting: Cardiovascular Disease

## 2017-07-11 ENCOUNTER — Telehealth: Payer: Self-pay | Admitting: Cardiovascular Disease

## 2017-07-11 MED ORDER — ATORVASTATIN CALCIUM 80 MG PO TABS: ORAL_TABLET | ORAL | 0 refills | Status: DC

## 2017-07-11 NOTE — Telephone Encounter (Signed)
New message      *STAT* If patient is at the pharmacy, call can be transferred to refill team.   1. Which medications need to be refilled? (please list name of each medication and dose if known)   atorvastatin (LIPITOR) 80 MG tablet TAKE 1 TABLET DAILY AT 6PM PLEASE MAKE AN APPOINTMENT WITH YOUR DOCTOR         2. Which pharmacy/location (including street and city if local pharmacy) is medication to be sent to? [facility]  3. Do they need a 30 day or 90 day supply? 30 days has appt 08/07/17 with Corine Shelter

## 2017-08-07 ENCOUNTER — Ambulatory Visit (INDEPENDENT_AMBULATORY_CARE_PROVIDER_SITE_OTHER): Payer: 59 | Admitting: Cardiology

## 2017-08-07 ENCOUNTER — Encounter: Payer: Self-pay | Admitting: Cardiology

## 2017-08-07 DIAGNOSIS — Z9861 Coronary angioplasty status: Secondary | ICD-10-CM | POA: Diagnosis not present

## 2017-08-07 DIAGNOSIS — R002 Palpitations: Secondary | ICD-10-CM | POA: Diagnosis not present

## 2017-08-07 DIAGNOSIS — E785 Hyperlipidemia, unspecified: Secondary | ICD-10-CM

## 2017-08-07 DIAGNOSIS — I252 Old myocardial infarction: Secondary | ICD-10-CM | POA: Diagnosis not present

## 2017-08-07 DIAGNOSIS — I2510 Atherosclerotic heart disease of native coronary artery without angina pectoris: Secondary | ICD-10-CM | POA: Diagnosis not present

## 2017-08-07 MED ORDER — METOPROLOL SUCCINATE ER 25 MG PO TB24: 25.0000 mg | ORAL_TABLET | Freq: Every day | ORAL | 6 refills | Status: DC | PRN

## 2017-08-07 MED ORDER — NITROGLYCERIN 0.4 MG SL SUBL: 0.4000 mg | SUBLINGUAL_TABLET | SUBLINGUAL | 1 refills | Status: DC | PRN

## 2017-08-07 MED ORDER — ATORVASTATIN CALCIUM 80 MG PO TABS: 80.0000 mg | ORAL_TABLET | Freq: Every day | ORAL | 3 refills | Status: AC

## 2017-08-07 NOTE — Assessment & Plan Note (Signed)
Pt seen today after he had a "bad week" of palpitations- frequent extra beats at rest. It does not sound like he had sustained tachycardia.

## 2017-08-07 NOTE — Patient Instructions (Addendum)
Medication Instructions:  START Metoprolol Succinate 25mg Take 1 tablet daily as needed   Labwork: None   Testing/Procedures: None   Follow-Up: Your physician wants you to follow-up in: 12 months with Dr Royann Shivers. You will receive a reminder letter in the mail two months in advance. If you don't receive a letter, please call our office to schedule the follow-up appointment. IF YOU DO NOT HEAR BACK FROM OUR OFFICE BY MARCH GIVE THE OFFICE A CALL TO SCHEDULE APPOINTMENT WITH DR CROITORU ONLY.  Any Other Special Instructions Will Be Listed Below (If Applicable). STOP CAFFINE  If you need a refill on your cardiac medications before your next appointment, please call your pharmacy.

## 2017-08-07 NOTE — Progress Notes (Signed)
08/07/2017 Lance Boyer   05-02-1965  [id number]  Primary Physician Joycelyn Rua, MD Primary Cardiologist: Dr Royann Shivers  HPI:  52 y.o.married male,with early-onset CAD who received 2 drug-eluting stents in July 2017 following a NSTEMI- (p LAD Synergy 3x28 and d PDA Synergy 3.5 x 24). He tolerated this well.  LVF was normal. He was enrolled in the [facility] study. He had some chest discomfort in Jan 2018- Myoview Feb 2018 was low risk. He had stopped his lopressor in the past secondary to dizziness. The pt stopped his Brilinta in Dec 2018. He has been doing well, he exercises daily on a stationary bike. Last month he noted an increase in palpitations at rest. He denies sustained tachycardia. He admits to drinking 2 cups of coffee a day. His symptoms has resolved over the past week. He denies any chest pain. He has recently moved to [facility] near [employer]. He asks that his PCP follow his lipids as he is closer.     Current Outpatient Medications  Medication Sig Dispense Refill  . aspirin EC 81 MG tablet Take 1 tablet (81 mg total) by mouth daily. 90 tablet 3  . atorvastatin (LIPITOR) 80 MG tablet TAKE 1 TABLET DAILY AT 6PM must keep appt for future refills 30 tablet 0  . nitroGLYCERIN (NITROSTAT) 0.4 MG SL tablet Place 1 tablet (0.4 mg total) under the tongue every 5 (five) minutes x 3 doses as needed for chest pain. 25 tablet 3   No current facility-administered medications for this visit.     No Known Allergies  Past Medical History:  Diagnosis Date  . Coronary artery disease    a. PCI 11/02/2015 90% mid LAD s/p DES, 75% Prox RCA s/p DES  . GERD (gastroesophageal reflux disease)   . High cholesterol 10/08/2014  . Myocardial infarction [facility]) 11/01/2015    Social History   Socioeconomic History  . Marital status: Married    Spouse name: Not on file  . Number of children: Not on file  . Years of education: Not on file  . Highest education level: Not on file    Occupational History  . Not on file  Social Needs  . Financial resource strain: Not on file  . Food insecurity:    Worry: Not on file    Inability: Not on file  . Transportation needs:    Medical: Not on file    Non-medical: Not on file  Tobacco Use  . Smoking status: Never Smoker  . Smokeless tobacco: Never Used  Substance and Sexual Activity  . Alcohol use: Yes    Comment: 11/02/2015 "might have a few drinks a few times/year"  . Drug use: No  . Sexual activity: Yes  Lifestyle  . Physical activity:    Days per week: Not on file    Minutes per session: Not on file  . Stress: Not on file  Relationships  . Social connections:    Talks on phone: Not on file    Gets together: Not on file    Attends religious service: Not on file    Active member of club or organization: Not on file    Attends meetings of clubs or organizations: Not on file    Relationship status: Not on file  . Intimate partner violence:    Fear of current or ex partner: Not on file    Emotionally abused: Not on file    Physically abused: Not on file    Forced sexual activity:  Not on file  Other Topics Concern  . Not on file  Social History Narrative  . Not on file     No family history on file.   Review of Systems: General: negative for chills, fever, night sweats or weight changes.  Cardiovascular: negative for chest pain, dyspnea on exertion, edema, orthopnea, palpitations, paroxysmal nocturnal dyspnea or shortness of breath Dermatological: negative for rash Respiratory: negative for cough or wheezing Urologic: negative for hematuria Abdominal: negative for nausea, vomiting, diarrhea, bright red blood per rectum, melena, or hematemesis Neurologic: negative for visual changes, syncope, or dizziness All other systems reviewed and are otherwise negative except as noted above.    Blood pressure 124/86, pulse 68, height 5\' 10"  (1.778 m), weight 178 lb (80.7 kg).  General appearance: alert,  cooperative and no distress Neck: no carotid bruit and no JVD Lungs: clear to auscultation bilaterally Heart: regular rate and rhythm Extremities: extremities normal, atraumatic, no cyanosis or edema Skin: Skin color, texture, turgor normal. No rashes or lesions Neurologic: Grossly normal  EKG NSR-PR 106  ASSESSMENT AND PLAN:   Palpitations Pt seen today after he had a "bad week" of palpitations- frequent extra beats at rest. It does not sound like he had sustained tachycardia.   History of non-ST elevation myocardial infarction (NSTEMI) July 2017-Troponin 0.9, LVF normal  CAD S/P percutaneous coronary angioplasty CAD- s/p pLAD and dRCA PCI with DES July 2017  Dyslipidemia He would like his PCP to follow this- his last LDL was 55 on Lipitor 80 mg   PLAN  Stop caffeine. I gave him an Rx for Torpol 12.5 mg to start if he has recurrent palpitations. F/U Dr Royann Shivers in one year  Corine Shelter PA-C 08/07/2017 8:52 AM

## 2017-08-07 NOTE — Assessment & Plan Note (Signed)
CAD- s/p pLAD and dRCA PCI with DES July 2017

## 2017-08-07 NOTE — Assessment & Plan Note (Signed)
He would like his PCP to follow this- his last LDL was 55 on Lipitor 80 mg

## 2017-08-07 NOTE — Assessment & Plan Note (Signed)
July 2017-Troponin 0.9, LVF normal

## 2020-04-20 NOTE — Progress Notes (Signed)
 Primary Care Physician: Bennie KATHEE Apt, MD  Patient Active Problem List   Diagnosis Date Noted   Non-STEMI (non-ST elevated myocardial infarction) (*) 04/20/2020    02/20/2020 he was on a trip and developed chest pain.  The records available reveal that he was taken to the Cath Lab and found to have a total LAD proximal to the original stent and status post drug-eluting stent to the LAD.  He had low blood pressure readings and was not started on ACE inhibitor but was discharged on metoprolol  succinate 25 mg daily, aspirin , Brilinta  and Lipitor .  Initial echo during the hospitalization for his non-ST elevation MI revealed an EF in the range of 45-50% with anterior and anteroseptal hypokinesis He saw Dr. Lizette and was complaining of fatigue and a stress echo was ordered.  He had a baseline anterior wall motion abnormality which was also noted on his echo 11/21 when he was discharged after his stent placement.  His EF had improved to 55 to 60% and he exercised 9 minutes standard Bruce protocol reaching 100% predicted for age but he did have 2 mm of downsloping ST depression noted in the anterolateral inferior leads at peak exercise which resolved early in recovery and EF post exercises in the range of 60 to 65% with the anterior and anterior septal wall motion abnormality not significantly changed.  He had no chest pain and no stress-induced arrhythmia.  2017 history non-ST elevation MI and drug-eluting stent to the LAD and distal RCA.    Presence of drug coated stent in LAD coronary artery 04/20/2020    Status post drug-eluting stent to the LAD in 2017 and 02/20/2020 with stenosis proximal to the original stent    S/P right coronary artery (RCA) stent placement 04/20/2020    Status post drug-eluting stent to the distal RCA in 2017    Dyslipidemia 04/20/2020    On Lipitor  80 mg, LDL goal less than 70    History of ventricular tachycardia, 02/20/2020 i 04/20/2020    Levan/21, review of his records  reported that when he was hospitalized with a non-ST elevation MI and occlusion of the proximal LAD he had 1 paroxysm of nonsustained VT which reportedly was not recurrent and he was placed on metoprolol  and a drug-eluting stent was placed to the LAD.     Allergies: Patient has no known allergies.     Medication Sig Dispense Refill   aspirin  (ASPRI-LOW,ECOTRIN LOW DOSE) 81 mg EC tablet Take 81 mg by mouth daily.      atorvastatin  (LIPITOR ) 80 mg tablet TAKE 1 TABLET DAILY 90 tablet 1   famotidine (PEPCID) 20 MG tablet Take 40 mg by mouth at bedtime as needed for Heartburn. Sometimes will take 3 tabs at bedtime     isosorbide mononitrate (IMDUR) 30 mg 24 hr tablet Take one tablet (30 mg dose) by mouth daily. 30 tablet 5   metoprolol  succinate (TOPROL -XL) 25 mg 24 hr tablet Take one tablet (25 mg dose) by mouth daily. 90 tablet 1   nitroGLYCERIN  (NITROSTAT ) 0.4 mg SL tablet Place 0.4 mg under the tongue.     ticagrelor  (BRILINTA ) 90 mg tablet Take one tablet (90 mg dose) by mouth 2 (two) times daily. 180 tablet 1   No current facility-administered medications for this visit.    No chief complaint on file.   Clinical Summary: Follow-up non-ST elevation MI and recent stress testing ordered by Dr. Lizette  HPI: This is a pleasant active 55 year old gentleman who has a  history of coronary artery disease.  He reportedly had a non-ST elevation MI in 2017 with a drug-eluting stent to the LAD and distal RCA. He was traveling 02/20/2020 in [place]  and developed chest pain we have a cath report and discharge summary that has been reviewed.  His LHC revealed total proximal LAD which was proximal to the previous stent placement and EF was in the range of 45-50% with anterior and anteroseptal hypokinesis noted.  The stent to the RCA was reportedly patent.  He had a drug-eluting stent placed to the proximal LAD and had 1 episode of nonsustained VT at that time and reportedly had no recurrence, he was  placed on a beta-blocker.  He was not placed on an ACE inhibitor due to low blood pressure readings.  He has been compliant on Brilinta  and aspirin .  He saw Dr. Lizette 03/28/20 and was complaining of some fatigue and palpitations.  Dr. Lizette ordered a stress echo.  His echocardiogram revealed a baseline anterior and anteroseptal motion abnormality with an EF which had improved in the range of 55% with augmentation of his overall LV function with exercise however he had 2 mm of downsloping ST depression at peak exercise in the inferior and anterolateral leads.  He exercised well 9 minutes of standard Bruce protocol 100% predicted for age and had no stress-induced arrhythmia and had no chest pain.  He insisted that he felt well throughout the stress test and post-rest.  The study was performed off beta-blockers.  He was placed on Imdur 30 mg and states that he has had headaches, he just started the medicine yesterday since he wished to get it from his mail order. He states that since his MI in November he has had intermittent episodes of palpitations and last Saturday they were more bothersome and that he became short of breath with them and the episodes were more prolonged lasting a few minutes but then were recurrent.  He  had 1 episode of nonsustained VT however that was within 48 hours of his MI. He has a history of dyslipidemia on Lipitor  80 mg. He denies recurrence of any chest, arm or jaw pain on exerting himself but he states that he feels more fatigued and feels as if sometimes he cannot get a deep breath.  He denies PND orthopnea and states that he does not really feel short of breath on exerting himself but he has not been doing any regular exercise.  He works from home.    Past Medical History:  Diagnosis Date   Colon polyp    Coronary artery disease    GERD (gastroesophageal reflux disease)    Hyperlipidemia     Cardiac History:   1. Non-STEMI (non-ST elevated myocardial infarction) (*)   2.  Presence of drug coated stent in LAD coronary artery   3. S/P right coronary artery (RCA) stent placement   4. Dyslipidemia   5. History of ventricular tachycardia, 02/20/2020 i    LV function: Echo 02/20/2020, EF 45-50% 04/20/2019 echo EF in the range of 55-60%  Past Surgical History:  Procedure Laterality Date   Colonoscopy  12/02/2018   Coronary angioplasty with stent placement  10/08/2015   Upper gastrointestinal endoscopy  12/02/2018    Social History   Socioeconomic History   Marital status: Married    Spouse name: Tawni   Number of children: 4   Years of education: Not on file   Highest education level: Master's degree (e.g., MA, MS, MEng, MEd, MSW, MBA)  Occupational History   Not on file  Tobacco Use   Smoking status: Never Smoker   Smokeless tobacco: Never Used  Substance and Sexual Activity   Alcohol use: Yes    Alcohol/week: 1.0 standard drink    Types: 1 Cans of beer per week   Drug use: Never   Sexual activity: Yes    Partners: Female  Other Topics Concern   Not on file  Social History Narrative   Not on file   Social Determinants of Health   Financial Resource Strain: Not on file  Food Insecurity: Not on file  Transportation Needs: Not on file  Physical Activity: Not on file  Stress: Not on file  Social Connections: Not on file  Intimate Partner Violence: Not on file  Housing Stability: Not on file    Family History  Problem Relation Age of Onset   Diabetes Father    Kidney failure Father    Heart disease Father        41 yo   Heart attack Father 20   Hyperlipidemia Father    Cancer Paternal Uncle        Lung - chain smoker   Heart attack Maternal Grandfather 44   Colon cancer Neg Hx    Colon polyps Neg Hx     I have reviewed the new patient information form.  ROS:  EYES:  Denies diplopia, history of glaucoma or visual field defects. RESPIRATORY:  Denies exertional dyspnea, denies cough, wheezing, or  hemoptysis. CARDIOVASCULAR:  Please review HPI ABDOMINAL:  Denies ulcer disease, hematochezia or melena. MUSCULOSKELETAL:  Denies any history of venous insufficiency, arthritic symptoms or back problems. NEUROLOGICAL:  Denies any history of strokes, TIA, seizures, or dizziness.  He denies any history of syncope ENDOCRINE:  Denies any history of weight change, heat/cold intolerance, polydipsia, or polyuria.  Denies any history of diabetes or thyroid disease  PHYSICAL EXAM:  BP 120/82   Pulse 76   Ht 5' 9 (1.753 m)   Wt 179 lb (81.2 kg)   SpO2 97%   BMI 26.43 kg/m  CONSTITUTIONAL:  Cooperative, alert and oriented, well developed, well nourished, in no acute distress. SKIN:  Warm and dry to touch,  HEAD:  Normocephalic, normal hair pattern. EYES:  Conjunctivae and lids unremarkable, funduscopic exam and visual fields not performed. ENT:  No pallor or cyanosis,  NECK:  No JVD, no carotid bruit appreciated CHEST:  Normal symmetry, no tenderness to palpation, normal respiratory excursion, no intercostal retraction, no use of accessory muscles, normal diaphragmatic excursion, clear to auscultation. CARDIAC:  Regular rhythm, S1 normal, S2 normal, No S3 or S4, apical impulse not displaced, no murmurs, clicks or rubs detected. ABDOMEN:  abdomen soft, bowel sounds normoactive, no hepatosplenomegaly, non-tender, no bruits, stool guaic not necessary. PERIPHERAL PULSES:  The femoral, dorsalis pedis, and posterior tibial pulses are normal amplitude and equal bilaterally with no bruits auscultated. EXTREMITIES & BACK:  No deformities, clubbing, cyanosis, erythema or edema observed, there are no spinal abnormalities noted, normal muscle strength and tone. PSYCHIATRIC:  No difficulties with speech or language NEUROLOGICAL:  Affect appropriate, oriented to time, person and place.    LABS:   Chemistry      Component Value Date/Time   NA 138 12/25/2019 1116   K 5.0 12/25/2019 1116   CL 100 12/25/2019  1116   CO2 24 12/25/2019 1116   BUN 15 12/25/2019 1116   CREATININE 1.20 12/25/2019 1116   GLUCOSE 92 12/25/2019 1116  Component Value Date/Time   CALCIUM  10.1 12/25/2019 1116   ALKPHOS 85 12/25/2019 1116   AST 25 12/25/2019 1116   ALT 33 12/25/2019 1116   BILITOT 0.6 12/25/2019 1116       Lab Results  Component Value Date   Cholesterol, Total 154 12/25/2019   Lab Results  Component Value Date   HDL 62 12/25/2019   Lab Results  Component Value Date   LDL 77 12/25/2019   Lab Results  Component Value Date   Triglycerides 81 12/25/2019   No results found for: CHOLHDL  I have reviewed recent labs    IMPRESSION/Plan:  Problem List      Cardiology Problems   Non-STEMI (non-ST elevated myocardial infarction) (*) - Primary   Overview    02/20/2020 he was on a trip and developed chest pain.  The records available reveal that he was taken to the Cath Lab and found to have a total LAD proximal to the original stent and status post drug-eluting stent to the LAD.  He had low blood pressure readings and was not started on ACE inhibitor but was discharged on metoprolol  succinate 25 mg daily, aspirin , Brilinta  and Lipitor .  Initial echo during the hospitalization for his non-ST elevation MI revealed an EF in the range of 45-50% with anterior and anteroseptal hypokinesis He saw Dr. Lizette and was complaining of fatigue and a stress echo was ordered.  He had a baseline anterior wall motion abnormality which was also noted on his echo 11/21 when he was discharged after his stent placement.  His EF had improved to 55 to 60% and he exercised 9 minutes standard Bruce protocol reaching 100% predicted for age but he did have 2 mm of downsloping ST depression noted in the anterolateral inferior leads at peak exercise which resolved early in recovery and EF post exercises in the range of 60 to 65% with the anterior and anterior septal wall motion abnormality not significantly changed.  He had no  chest pain and no stress-induced arrhythmia.  2017 history non-ST elevation MI and drug-eluting stent to the LAD and distal RCA.        Other   Presence of drug coated stent in LAD coronary artery   Overview    Status post drug-eluting stent to the LAD in 2017 and 02/20/2020 with stenosis proximal to the original stent      S/P right coronary artery (RCA) stent placement   Overview    Status post drug-eluting stent to the distal RCA in 2017      Dyslipidemia   Overview    On Lipitor  80 mg, LDL goal less than 70      History of ventricular tachycardia, 02/20/2020 i   Overview    Levan/21, review of his records reported that when he was hospitalized with a non-ST elevation MI and occlusion of the proximal LAD he had 1 paroxysm of nonsustained VT which reportedly was not recurrent and he was placed on metoprolol  and a drug-eluting stent was placed to the LAD.        I have recommended that he increase his metoprolol  succinate to 50 mg daily.  He will continue DAPT, Lipitor .  If  the Imdur continues to cause headaches in spite of Tylenol  he should decrease it to 15 mg and then we may discontinue it since it may not have made any difference since he is not having any exertional chest pain. We will place a 2-week monitor since he states that he  is having the episodes a number of times per week although he denies any associated lightheadedness or syncope. Depending on his symptoms in the future we may decide to reevaluate him with cardiac catheterization.  They understand and wish to continue medical management but would be willing to go to [facility] if his symptoms change for further evaluation with angiography.  He understands if he has any recurrent chest pain or other anginal equivalent that we have talked about that he should be seen in the emergency room locally.  He will continue Brilinta  and aspirin  and will likely require this long-term in light of the history of restenosis.  Risks,  benefits, and alternatives of the medications and treatment plan prescribed today were discussed, and patient expressed understanding. Plan follow-up as discussed or as needed if any worsening symptoms or change in condition.    Health Maintenance issues including appropriate healthy diet, exercise and smoking avoidance were discussed with pt.  Follow up in about 4 weeks (around 05/19/2020).   Patient's Medications       Accurate as of April 20, 2020  5:56 PM. Reflects encounter med changes as of last refresh        Continued Medications     Instructions  aspirin  81 mg EC tablet Commonly known as: ASPRI-LOW,ECOTRIN LOW DOSE  81 mg, Oral, Daily   atorvastatin  80 mg tablet Commonly known as: LIPITOR   TAKE 1 TABLET DAILY   famotidine 20 MG tablet Commonly known as: PEPCID  40 mg, Oral, At bedtime as needed, Sometimes will take 3 tabs at bedtime    isosorbide mononitrate 30 mg 24 hr tablet Commonly known as: IMDUR  30 mg, Oral, Daily   metoprolol  succinate 25 mg 24 hr tablet Commonly known as: TOPROL -XL  25 mg, Oral, Daily   nitroGLYCERIN  0.4 mg SL tablet Commonly known as: NITROSTAT   0.4 mg, Sublingual   ticagrelor  90 mg tablet Commonly known as: BRILINTA   90 mg, Oral, 2 times a day

## 2021-08-11 NOTE — Progress Notes (Signed)
 " Subjective:    Lance Boyer is a 56 y.o. (DOB 1965-07-01) male.     Patient presents with   Coronary Artery Disease     Lance Boyer presents for Cardiology follow up. History of coronary artery disease post PCI LAD in 2021. Prior PCI RCA. Patient reports occasional chest discomfort and palpitations. History of NSVT. [place] recently moved to this area from [place]. Last seen in the [place] office in 10/2020. EKG today sinus rhythm with nonspecific T abnormality. B/P controlled this visit. Patient has noted frequent PVCs on his smart phone monitor.   Coronary Artery Disease This is a chronic problem. The current episode started more than 1 year ago. The problem has been waxing and waning. Associated symptoms include chest pain. Pertinent negatives include no congestion or weakness. The symptoms are aggravated by exertion and stress. He has tried rest for the symptoms.     Reviewed and updated this visit by provider: Tobacco  Allergies  Meds  Problems  Med Hx  SE Hx  Surg Hx  Fam Hx  Soc Hx      Review of Systems  Constitutional: Negative for activity change.  HENT: Negative for congestion.   Eyes: Negative for visual disturbance.  Respiratory: Negative for shortness of breath.   Cardiovascular: Positive for chest pain and palpitations. Negative for leg swelling.  Neurological: Negative for dizziness, weakness and light-headedness.  Hematological: Does not bruise/bleed easily.   Lab Results  Component Value Date   ALT (SGPT) 43 06/05/2021   AST 27 06/05/2021   BUN 17 06/05/2021   Cholesterol, Total 179 06/05/2021   Chloride 107 (H) 06/05/2021   CO2 21 06/05/2021   Creatinine 1.08 06/05/2021   Glucose 108 (H) 06/05/2021   Hematocrit 42.8 06/05/2021   HDL 57 06/05/2021   Hemoglobin 14.4 06/05/2021   Potassium 5.0 06/05/2021   LDL 105 (H) 06/05/2021   Sodium 145 (H) 06/05/2021   Platelet Count 285 06/05/2021   PSA 0.9 12/25/2019   Triglycerides 94 06/05/2021    TSH 1.460 11/07/2020   WBC 6.1 06/05/2021      Objective:   Vitals:   08/11/21 1359  BP: 124/78  Patient Position: Sitting  Pulse: 63  Height: 1.727 m (5' 8)  Weight: 86 kg (189 lb 11.2 oz)  SpO2: 99%  BMI (Calculated): 28.9  PainSc: 0-No pain  PainLoc: Chest   Physical Exam Constitutional:      Appearance: He is not ill-appearing.  HENT:     Head: Atraumatic.     Nose: No congestion.     Mouth/Throat:     Pharynx: Oropharynx is clear.  Eyes:     Extraocular Movements: Extraocular movements intact.     Pupils: Pupils are equal, round, and reactive to light.  Cardiovascular:     Rate and Rhythm: Normal rate and regular rhythm.     Heart sounds: No murmur heard.    No gallop.  Musculoskeletal:        General: Normal range of motion.     Cervical back: Normal range of motion.  Pulmonary:     Effort: No respiratory distress.     Breath sounds: No wheezing or rales.  Abdominal:     General: There is no distension.     Tenderness: There is no abdominal tenderness.  Skin:    General: Skin is warm.  Neurological:     General: No focal deficit present.     Mental Status: He is alert and oriented to  person, place, and time.     Cranial Nerves: No cranial nerve deficit.     Motor: No weakness.       Assessment / Plan:   Assessment 1. Coronary artery disease involving native coronary artery of native heart without angina pectoris (Primary) -     ECG 12 lead -     Basic Metabolic Panel; Future -     CBC And Differential; Future -     Lipid Panel; Future -     Echo Stress Exercise W Dop Comp and Clr Flw WO Enh Agnt; Future    Plan  1. No medicine changes this visit 2. EKG 3  Lab work 4 Stress echocardiogram 5. Return in 4-6 weeks  Risks, benefits, and alternatives of the medications and treatment plan prescribed today were discussed, and patient expressed understanding. Plan follow-up as discussed or as needed if any worsening symptoms or change in  condition.        "

## 2024-01-13 NOTE — Progress Notes (Signed)
 Chief complaint   Chief Complaint  Patient presents with   Back Pain    Assessment   1. Sacroiliitis      2. Spondylosis of lumbar region without myelopathy or radiculopathy      3. Lumbar facet arthropathy      4. Chronic pain syndrome          Plan    Urine drug screen not need/not obtained today.  Injections: Plan for bilateral SI joint injection in [facility]. This procedure was discussed in detail with the patient. Risks/benefits/alternatives discussed. All questions were answered to their satisfaction.   Depending on results consider targeting lumbar facets   Adjuvant Medications: on Plavix   Opioid medications: None prescribed.  Specialized treatments/imaging: Provided patient with HEP today. He travels often for work therefore is not able to attend formal PT. Will provide home exercises for the patient  Follow up: Follow up for bilateral SI joint injection with Dr. Rosella .  The patients blood pressure was    Patient's pain was assessed, documented as positive, unless stated in the HPI, and a follow up plan has been documented in the Plan. Patient's medication list was reviewed and updated if applicable.  Body Mass Index (BMI) screening was documented and if the patients BMI was greater than 25 or less than 18.5, the patient was asked to follow up with their PCP for a full assessment regarding weight management. If Fluoroscopy was used during a procedure, the radiation exposure time and number of images were documented within the record.        History of Present Illness   Initial HPI Lance Boyer is a 58 y.o.year old male with a history of chronic low back pain.   Interval HPI: 01/13/2024 Patient last seen 08/2021. Since last visit, Lance Boyer' s pain has is unchanged. He continues to note axial low back pain and in upper buttock region, bilaterally. Denies any radicular symptoms to bilateral lower extremities.  His pain is typically worse with bending, twisting,  lifting.  He often travels for work which makes it hard for him to do any formal physical therapy however he does try to do home exercises and stretches.  As an aside he just received a shoulder injection which caused him to have severe hiccups for at least 24 hours.   Improvement with treatments: see below Activity Level- _adequate  Abberant Behavior- _nil  Procedures: none  Current pain medications: OTC  Narcotic Violations: N/a   Pill count: N/a  Last RUDS: N/a  Tests Reviewed   Xray lumbar spine 08/2021  FINDINGS:   No acute fracture or subluxation. Multilevel degenerative disc disease and facet arthropathy. Vertebral body height and alignment are otherwise well-maintained.  No acute soft tissue findings.      IMPRESSION:   Degenerative disease.     I personally reviewed and visualized the imaging studies if available.   Past Medical History    Reviewed and updated this visit by provider: Tobacco  Allergies  Meds  Problems  Med Hx  Surg Hx  Fam Hx        Physical Exam  BP:   Resp:      Pulse:          SPO2:     General: Alert and oriented, No acute distress.   Eye: Pupils are equal, round and reactive to light, Normal conjunctiva.   Respiratory:  Respirations are non-labored, Symmetrical chest wall expansion.   Cardiovascular:  Normal rate, Normal peripheral perfusion.  Integumentary:  Warm, Dry, Pink, No rash.     Cognition and Speech:  Oriented, Speech clear and coherent.   Psychiatric:  Cooperative, Appropriate mood & affect, Normal judgment.   Back:  Facet loading maneuvers are positive.  Positive PSIS tenderness bilaterally, equivocal Faber's, compression exam, thigh thrust. Extremities: Lower extremity motor strength appears to be grossly intact. Extensor hallucis longus is intact bilaterally.    Neuro: normal without focal findings mental status, speech normal, alert and oriented gait and station normal,     Deidre Alice, NP 01/13/2024 /  8:19 AM

## 2024-02-28 NOTE — Progress Notes (Signed)
 "  Subjective:    Lance Boyer is a 58 y.o. (DOB [birth year]) male.     Patient presents with   Coronary Artery Disease     Lance Boyer presents for Cardiology follow up.  Patient presents for cardiac clearance prior to an orthopedic procedure.  Patient is scheduled to have shoulder surgery next month.  History of coronary artery disease post prior interventions of the LAD and RCA.  Patient denies chest pain or sublingual nitroglycerin  use.  No exertional dyspnea.  Blood pressure well-controlled this visit.  EKG today sinus rhythm with no ischemic changes.  Patient had a low risk stress echocardiogram in this office 09/2021.  08/2021-  History of coronary artery disease post PCI LAD in 2021. Prior PCI RCA. Patient reports occasional chest discomfort and palpitations. History of NSVT. Patient recently moved to this area from [place]. Last seen in the [place] office in 10/2020. EKG today sinus rhythm with nonspecific T abnormality. B/P controlled this visit. Patient has noted frequent PVCs on his smart phone monitor.   Stress echocardiogram 09/2021   Left Ventricle: The left ventricular systolic function at rest is 55-60%. Systolic function is normal.   Stress: The patient underwent a Bruce protocol stress test.   Stress: Total exercise time was 8 minutes. Estimated METs = 10.0.  and a peak blood pressure of 160/72.   Stress: The patient experienced no chest pain.   Stress: There was an appropriate heart rate response to exercise. The patient achieved a heart rate of 160 beats per minute at maximum stress, 96% of the maximal age-predicted heart rate.   Stress ECG: ECG Conclusion:  No ischemic ST segment changes occurred with stress.   Post-stress: The left ventricle systolic function is normal post-stress.   Post-stress: The post-stress echo showed normal wall motion which was improved compared to baseline.   Post-stress Impression: The study is negative and shows no echocardiographic  evidence of ischemia.   Post-stress Impression: This study shows a low prognostic risk.   Coronary Artery Disease This is a chronic problem. The current episode started more than 1 year ago. The problem has been unchanged. Pertinent negatives include no abdominal pain, chest pain, congestion, diaphoresis, fatigue, nausea or weakness. The symptoms are aggravated by exertion and stress. He has tried rest for the symptoms.     Reviewed and updated this visit by provider: Tobacco  Allergies  Meds  Med Hx  SE Hx  Surg Hx  Fam Hx  Soc Hx      Review of Systems  Constitutional:  Negative for activity change, diaphoresis and fatigue.  HENT:  Negative for congestion.   Eyes:  Negative for visual disturbance.  Respiratory:  Negative for shortness of breath.   Cardiovascular:  Positive for palpitations. Negative for chest pain and leg swelling.  Gastrointestinal:  Negative for abdominal pain and nausea.  Neurological:  Negative for dizziness, weakness and light-headedness.  Hematological:  Does not bruise/bleed easily.   Lab Results  Component Value Date   WBC 6.7 04/26/2023   Hemoglobin 13.9 04/26/2023   Hematocrit 42.7 04/26/2023   Platelet Count 309 04/26/2023   Cholesterol, Total 143 04/26/2023   Triglycerides 75 04/26/2023   HDL 70 04/26/2023   LDL 58 04/26/2023   ALT (SGPT) 34 04/26/2023   AST 26 04/26/2023   Sodium 141 04/26/2023   Potassium 4.7 04/26/2023   Chloride 104 04/26/2023   Creatinine 1.10 04/26/2023   BUN 13 04/26/2023   CO2 22 04/26/2023  TSH 1.850 04/26/2023   PSA, Free % 47.5 04/26/2023   PSA, Free 0.38 04/26/2023   PSA 0.8 04/26/2023   Glucose 118 (H) 04/26/2023   Hemoglobin A1c 6.0 (A) 04/26/2023        Objective:   Vitals:   02/28/24 1458  BP: 108/72  Patient Position: Sitting  Pulse: 67  Weight: 195 lb (88.5 kg)  SpO2: 97%  PainSc: 0-No pain  PainLoc: Chest    Physical Exam Constitutional:      Appearance: He is not  ill-appearing.  HENT:     Head: Atraumatic.     Nose: No congestion.     Mouth/Throat:     Pharynx: Oropharynx is clear.  Eyes:     Extraocular Movements: Extraocular movements intact.     Pupils: Pupils are equal, round, and reactive to light.  Cardiovascular:     Rate and Rhythm: Normal rate and regular rhythm.     Heart sounds: No murmur heard.    No gallop.  Musculoskeletal:        General: Normal range of motion.     Cervical back: Normal range of motion.  Pulmonary:     Effort: No respiratory distress.     Breath sounds: No wheezing or rales.  Abdominal:     General: There is no distension.     Tenderness: There is no abdominal tenderness.  Skin:    General: Skin is warm.  Neurological:     General: No focal deficit present.     Mental Status: He is alert and oriented to person, place, and time.     Cranial Nerves: No cranial nerve deficit.     Motor: No weakness.        Assessment / Plan:   Assessment 1. Coronary artery disease involving native coronary artery of native heart without angina pectoris (Primary) -     ECG 12 lead -     Echo Stress Exercise W Dop Comp and Clr Flw WO Enh Agnt; Future -     Basic Metabolic Panel; Future -     CBC And Differential; Future -     Lipid Panel; Future -     NT-proBNP; Future 2. S/P right coronary artery (RCA) stent placement -     ECG 12 lead -     Echo Stress Exercise W Dop Comp and Clr Flw WO Enh Agnt; Future -     Basic Metabolic Panel; Future -     CBC And Differential; Future -     Lipid Panel; Future -     NT-proBNP; Future 3. Presence of drug coated stent in LAD coronary artery -     ECG 12 lead -     Echo Stress Exercise W Dop Comp and Clr Flw WO Enh Agnt; Future -     Basic Metabolic Panel; Future -     CBC And Differential; Future -     Lipid Panel; Future -     NT-proBNP; Future 4. History of ventricular tachycardia, 02/20/2020 i -     ECG 12 lead -     Echo Stress Exercise W Dop Comp and Clr Flw WO  Enh Agnt; Future -     Basic Metabolic Panel; Future -     CBC And Differential; Future -     Lipid Panel; Future -     NT-proBNP; Future     Plan No medication changes this visit.  Patient may continue long-term Plavix  therapy postprocedure Lab work  next week Stress echocardiogram Return in 3 months   Risks, benefits, and alternatives of the medications and treatment plan prescribed today were discussed, and patient expressed understanding. Plan follow-up as discussed or as needed if any worsening symptoms or change in condition.         "

## 2024-03-17 NOTE — Progress Notes (Signed)
 PATIENT: Lance Boyer MRN:  [id number] DOB:  [birth year] DATE OF SERVICE:  03/19/2024  Referring Physician:  Corrington, Kip A, MD Primary Physician:  Kip A Corrington, MD  Assessment      1. Bursitis of right shoulder      2. Right shoulder pain, unspecified chronicity        Plan     PLAN:   Prescribed medications: Oxycodone  5mg , Robaxin , Zofran  given at pre-op visit.  Proceed with scheduled surgery:  RIGHT Shoulder arthroscopy with limited debridement, subacromial decompression, rotator cuff repair .  DME provided: Ultrasling Plan for resumption of Plavix  for DVT prophylaxis.   [place] PDMP was reviewed. Patient educated on the addiction risks of this medication and how to take them safely. Risks, benefits, and alternatives of the medications and treatment plan prescribed today were discussed.  All questions were answered and the patient expressed understanding and agreement with the treatment plan. Plan follow-up as discussed or as needed if any worsening symptoms or change in condition.     Follow up as scheduled post-operatively.  Chief Complaint:  Chief Complaint  Patient presents with   Pre-op Exam    Upcoming shoulder arthroscopy with Dr. Hendrick on 03-26-2024     Subjective   Lance Boyer is a 58 y.o. male who presents today for pre-operative evaluation. Past medical history is reviewed today.   Current Outpatient Medications  Medication Sig Dispense Refill   atorvastatin  (LIPITOR ) 80 mg tablet Take one tablet (80 mg dose) by mouth daily. 90 tablet 3   clopidogrel  bisulfate (PLAVIX ) 75 mg tablet Take one tablet (75 mg dose) by mouth daily. 90 tablet 3   ezetimibe (ZETIA) 10 MG tablet Take one tablet (10 mg dose) by mouth daily. 90 tablet 3   famotidine (PEPCID) 10 mg tablet Take one tablet (10 mg dose) by mouth daily. 90 tablet 3   methocarbamol  (ROBAXIN ) 500 mg tablet Take one tablet (500 mg dose) by mouth 4 (four) times daily for 10 days. Medication is to  be used after surgery 40 tablet 0   metoprolol  succinate (TOPROL -XL) 50 mg 24 hr tablet Take one tablet (50 mg dose) by mouth daily. 90 tablet 3   nitroGLYCERIN  (NITROSTAT ) 0.4 mg SL tablet Place one tablet (0.4 mg dose) under the tongue.     ondansetron  (ZOFRAN ) 4 mg tablet Take one tablet (4 mg dose) by mouth every 8 (eight) hours as needed for Nausea for up to 7 days. Medication is to be used after surgery 20 tablet 0   oxyCODONE  HCl (ROXICODONE ) 5 mg immediate release tablet Take one tablet (5 mg dose) by mouth every 4 (four) hours as needed for Pain for up to 5 days. Medication is to be used after surgery Max Daily Amount: 30 mg 30 tablet 0   No current facility-administered medications for this visit.   Past Medical History:  Diagnosis Date   Blood dyscrasia    Colon polyp    Coronary artery disease    GERD (gastroesophageal reflux disease)    Hyperlipidemia    Lumbar back pain with radiculopathy affecting left lower extremity 06/05/2021   Myocardial infarction (*)    2017, 2021   Past Surgical History:  Procedure Laterality Date   Colonoscopy  12/02/2018   Coronary angioplasty with stent placement  10/08/2015   Upper gastrointestinal endoscopy  12/02/2018   Social History   Social History Narrative   Not on file   Allergies[1]  Review of Systems:See below for the nursing review  of systems which I have reviewed, edited and corrected.  Review of Systems  Musculoskeletal:  Positive for joint pain and myalgias.  All other systems reviewed and are negative.  Objective   VITALS: There were no vitals taken for this visit.  PHYSICAL EXAMINATION: General Exam: patient appears well nourished, and to be of stated age. Neuro: alert and oriented, answers questions appropriately. Psychiatric: Normal mood and affect, in no acute distress. HEENT: normocephalic, atraumatic; EOM's in tact bilaterally; no cervical lymphadenopathy; no thyromegaly; hearing appropriate  for age. Dentition in good repair Skin: warm and dry, non diaphoretic.  No rash or signs of infection around area of future surgical incision.  Cardiovascular: extremities warm, well-perfused; regular rate and rhythm, peripheral pulses symmetrical. Pulmonary: resp even an unlabored Abdomen: soft, nontender, nondistended   Imaging: up to date   This note was dictated with voice recognition software. Similar sounding words can inadvertently be transcribed and not corrected upon review.    Author:  Adine GORMAN [place], PA-C 03/19/2024 3:44 PM          [1] No Known Allergies

## 2024-03-19 NOTE — Progress Notes (Signed)
 Chief complaint   Chief Complaint  Patient presents with   Follow-up    Assessment   1. Sacroiliitis      2. Lumbar spondylosis         Plan    Urine drug screen not need/not obtained today.  Injections: None planned.  He is undergoing right shoulder surgery soon.  I discussed proceeding with other treatment options for his low back pain after his surgery.  Could consider targeting lumbar facets with the goal of RFA to see how he responds.  Also discussed the peripheral nerve stimulator to the cluneal nerves.  Pamphlet provided.  Adjuvant Medications: on Plavix   Opioid medications: None prescribed.  Specialized treatments/imaging: Provided patient with HEP today. He travels often for work therefore is not able to attend formal PT. Will provide home exercises for the patient  Follow up: Follow up if symptoms worsen or fail to improve.  The patients blood pressure was 123/76  Patient's pain was assessed, documented as positive, unless stated in the HPI, and a follow up plan has been documented in the Plan. Patient's medication list was reviewed and updated if applicable.  Body Mass Index (BMI) screening was documented and if the patients BMI was greater than 25 or less than 18.5, the patient was asked to follow up with their PCP for a full assessment regarding weight management. If Fluoroscopy was used during a procedure, the radiation exposure time and number of images were documented within the record.        History of Present Illness   Initial HPI Lance Boyer is a 58 y.o.year old male with a history of chronic low back pain.   Interval HPI: 03/19/2024 Patient last seen 02/14/24 for a procedure. Since last visit, Lance Boyer' s pain has is stable. He received about 90% relief from his bilateral SI joint injection x 3 days.  He continues to note axial low back pain and in upper buttock region, bilaterally. Denies any radicular symptoms to bilateral lower extremities.  His  pain is typically worse with bending, twisting, lifting. He is to undergo right shoulder surgery next week.   As an aside he will have hiccup spells with steroid injections.    Improvement with treatments: see below Activity Level- _adequate  Abberant Behavior- _nil  Procedures: 02/14/24 - bilateral SI joint injection - 90% x 3 days  Current pain medications: OTC  Narcotic Violations: N/a   Pill count: N/a  Last RUDS: N/a  Tests Reviewed   Xray lumbar spine 08/2021  FINDINGS:   No acute fracture or subluxation. Multilevel degenerative disc disease and facet arthropathy. Vertebral body height and alignment are otherwise well-maintained.  No acute soft tissue findings.      IMPRESSION:   Degenerative disease.     I personally reviewed and visualized the imaging studies if available.   Past Medical History    Reviewed and updated this visit by provider: Tobacco  Allergies  Meds  Problems  Med Hx  Surg Hx  Fam Hx        Physical Exam  BP: 123/76 Resp:      Pulse: 69        SPO2:     General: Alert and oriented, No acute distress.   Eye: Pupils are equal, round and reactive to light, Normal conjunctiva.   Respiratory:  Respirations are non-labored, Symmetrical chest wall expansion.   Cardiovascular:  Normal rate, Normal peripheral perfusion.   Integumentary:  Warm, Dry, Pink, No rash.  Cognition and Speech:  Oriented, Speech clear and coherent.   Psychiatric:  Cooperative, Appropriate mood & affect, Normal judgment.   Extremities: Lower extremity motor strength appears to be grossly intact. Extensor hallucis longus is intact bilaterally.    Neuro: normal without focal findings mental status, speech normal, alert and oriented gait and station normal,     Deidre Alice, NP 03/19/2024 / 11:21 PM

## 2024-03-19 NOTE — H&P (Signed)
 PATIENT: Lance Boyer MRN:  [id number] DOB:  [birth year] DATE OF SERVICE:  03/19/2024  Referring Physician:  Corrington, Kip A, MD Primary Physician:  Kip A Corrington, MD  Assessment       1. Bursitis of right shoulder      2. Right shoulder pain, unspecified chronicity        Plan     PLAN:   Prescribed medications: Oxycodone  5mg , Robaxin , Zofran  given at pre-op visit.  Proceed with scheduled surgery:  RIGHT Shoulder arthroscopy with limited debridement, subacromial decompression, rotator cuff repair .  DME provided: Ultrasling Plan for resumption of Plavix  for DVT prophylaxis.   [place] PDMP was reviewed. Patient educated on the addiction risks of this medication and how to take them safely. Risks, benefits, and alternatives of the medications and treatment plan prescribed today were discussed.  All questions were answered and the patient expressed understanding and agreement with the treatment plan. Plan follow-up as discussed or as needed if any worsening symptoms or change in condition.      Follow up as scheduled post-operatively.  Chief Complaint:  Chief Complaint  Patient presents with   Pre-op Exam    Upcoming shoulder arthroscopy with Dr. Hendrick on 03-26-2024     Subjective   Lance Boyer is a 58 y.o. male who presents today for pre-operative evaluation. Past medical history is reviewed today.   Current Outpatient Medications  Medication Sig Dispense Refill   atorvastatin  (LIPITOR ) 80 mg tablet Take one tablet (80 mg dose) by mouth daily. 90 tablet 3   clopidogrel  bisulfate (PLAVIX ) 75 mg tablet Take one tablet (75 mg dose) by mouth daily. 90 tablet 3   ezetimibe (ZETIA) 10 MG tablet Take one tablet (10 mg dose) by mouth daily. 90 tablet 3   famotidine (PEPCID) 10 mg tablet Take one tablet (10 mg dose) by mouth daily. 90 tablet 3   methocarbamol  (ROBAXIN ) 500 mg tablet Take one tablet (500 mg dose) by mouth 4 (four) times daily for 10 days. Medication is to  be used after surgery 40 tablet 0   metoprolol  succinate (TOPROL -XL) 50 mg 24 hr tablet Take one tablet (50 mg dose) by mouth daily. 90 tablet 3   nitroGLYCERIN  (NITROSTAT ) 0.4 mg SL tablet Place one tablet (0.4 mg dose) under the tongue.     ondansetron  (ZOFRAN ) 4 mg tablet Take one tablet (4 mg dose) by mouth every 8 (eight) hours as needed for Nausea for up to 7 days. Medication is to be used after surgery 20 tablet 0   oxyCODONE  HCl (ROXICODONE ) 5 mg immediate release tablet Take one tablet (5 mg dose) by mouth every 4 (four) hours as needed for Pain for up to 5 days. Medication is to be used after surgery Max Daily Amount: 30 mg 30 tablet 0   No current facility-administered medications for this visit.   Past Medical History:  Diagnosis Date   Blood dyscrasia    Colon polyp    Coronary artery disease    GERD (gastroesophageal reflux disease)    Hyperlipidemia    Lumbar back pain with radiculopathy affecting left lower extremity 06/05/2021   Myocardial infarction (*)    2017, 2021   Past Surgical History:  Procedure Laterality Date   Colonoscopy  12/02/2018   Coronary angioplasty with stent placement  10/08/2015   Upper gastrointestinal endoscopy  12/02/2018   Social History   Social History Narrative   Not on file   [Allergies]  [Allergies] No Known Allergies  Review  of Systems:See below for the nursing review of systems which I have reviewed, edited and corrected.  Review of Systems  Musculoskeletal:  Positive for joint pain and myalgias.  All other systems reviewed and are negative.  Objective   VITALS: There were no vitals taken for this visit.  PHYSICAL EXAMINATION: General Exam: patient appears well nourished, and to be of stated age. Neuro: alert and oriented, answers questions appropriately. Psychiatric: Normal mood and affect, in no acute distress. HEENT: normocephalic, atraumatic; EOM's in tact bilaterally; no cervical lymphadenopathy; no  thyromegaly; hearing appropriate for age. Dentition in good repair Skin: warm and dry, non diaphoretic.  No rash or signs of infection around area of future surgical incision.  Cardiovascular: extremities warm, well-perfused; regular rate and rhythm, peripheral pulses symmetrical. Pulmonary: resp even an unlabored Abdomen: soft, nontender, nondistended   Imaging: up to date   This note was dictated with voice recognition software. Similar sounding words can inadvertently be transcribed and not corrected upon review.    Author:  Adine GORMAN [place], PA-C 03/19/2024 3:42 PM

## 2024-03-26 NOTE — Anesthesia Procedure Notes (Signed)
 Interscalene Block Patient location during procedure: pre-op Reason for block: post op pain management at surgeon's request Patient monitoring: EKG, HR, NIBP and SpO2  Universal Protocols: Procedure explained and questions answered to patient or proxy's satisfaction: yes  Relevant documents present and verified: yes Site/side marked: yes   Immediately prior to procedure a time out was called: yes A time out verifies correct patient, procedure, equipment/supplies available, support staff, safety concerns (including but not limited to fire safety, estimated blood loss, appropriate positioning, etc.) and site/side marked as required. Patient identity confirmed: verbally with patient, arm band and MRN      Staff:  Performed: anesthesiologist  Performed by: Burnard LELON Grippe, MD Authorized by: Burnard LELON Grippe, MD    Peripheral Nerve Block  Laterality: right Injection technique: single-shot Patient position: semi-sitting Prep: chlorhexidine gluconate and isopropyl alcohol, sterile technique and prep, sterile gloves used, sterile drape used, mask used and hand hygiene performed Local infiltration: lidocaine  (1-2 cc intradermal) Through needleSterile gel and sterile probe cover usedNormal Anatomic location/structure noted and Proper spread of local anesthetic visualized Needle Needle type: short-bevel and stimulating  Needle gauge: 22g Needle length: 4 in Needle localization: ultrasound guidance and permanent image obtained and placed in record   Assessment Injection assessment: incremental injection, nerves and surrounding structures identified, negative aspiration for heme, no paresthesia on injection, US  used to visualize LA spread, easy to inject and no IV injection Patient tolerance: patient tolerated the procedure well with no immediate complications.   Post-procedure details:  Post procedure diagnosis: ~. Estimated Blood loss: None Specimen Collected? No.    Additional  Notes Risks/benefits of regional anesthesia (including prolonged tingling/numbness, bleeding/hematoma, infection, nerve damage/injury) discussed; pt consented for peripheral nerve block.  USG used to locate trunks in interscalene groove; CHX prep; lidocaine  skin wheal; Needle advanced until deltoid twitch elicited. Nerve stimulator turned down, twitch lost at approximately 0.4 mA. Total of 10 cc Exparel 1.3% + 8 cc 0.25% bupivacaine injected under USG in 5 cc increments with negative aspiration between each injection. USG revealed nice spread of LA around nerve trunks, within interscalene groove.  Pt tolerated well with no complications.  :

## 2024-03-26 NOTE — Anesthesia Postprocedure Evaluation (Signed)
" °  Patient: Lance Boyer Procedures: RIGHT Shoulder arthroscopy with limited debridement, subacromial decompression, rotator cuff repair, distal clavicle resection  Anesthesia type: general  Anesthesia Post Evaluation  Patient location during evaluation: PACU Patient participation: complete - patient participated Level of consciousness: awake Pain management: adequate Airway patency: patent Cardiovascular status: acceptable Respiratory status: acceptable Hydration status: acceptable Vitals: stable Temperature: temperature adequate >96.21F PONV: nausea and vomiting controlled    BP: 130/80 (03/26/24 1700) Heart Rate: 71 (03/26/24 1700) Resp: 18 (03/26/24 1700) Temp: 97.5 F (36.4 C) (03/26/24 1657) SpO2: 96 % (03/26/24 1700) Weight: 89.1 kg (196 lb 8 oz) (03/26/24 1305) BMI (Calculated): 29 (03/26/24 1305)     Notable Events:    No Anesthesia notable events documented.  "

## 2024-03-26 NOTE — Anesthesia Preprocedure Evaluation (Addendum)
" °  Anesthesia ROS/Med History   Patient summary reviewed Anesthesia History Patient Has Had Previous Anesthesia (-) Anesthesia Complications Neuro/Psych (-) Seizures (-) CVA Pulmonary  (-) Asthma (-) COPD (-) Obstructive Sleep Apnea (OSA) (-) Smoking History Cardiovascular  Comments: PCI LAD in 2021. Prior PCI RCA 2017  NEG RECENT STRESS TEST  Home BP runs 120-130/70-80s:  (+) Hypertension (HTN), status: Well Controlled (+) Coronary Artery Disease (CAD) type:  History of MI GI/Hepatic  (+) Gastroesophageal Reflux Disease (GERD), control status: Well-Controlled Hematology  (-) DVT/PE (-) Anticoagulant/Antiplatelet Use Endocrine/Cancer/Other  (-) Diabetes Mellitus (-) Hypothyroidism Dental  Patient Has: No Notable Dental Hx          Physical Exam  Airway Mallampati: I TM distance: >3 FB Neck ROM: full     Cardiovascular  Rhythm: regular Rate: normal   Dental - normal exam  Pulmonary Breath sounds clear to auscultation   Abdominal   Neuro/Psych         Anesthesia Plan (GETA ISB for post op analgesia Careful attention to maintenance of adequate MAP in beachchair position 2/2 CAD)Anesthetic plan and risks discussed with patient. ASA 2   general   Plan discussed with CRNA.  intravenous induction Anesthesia Special needs:  Block       Date of last liquid: 03/26/24 (03/26/24 1251)  Time of last liquid: 0630 (03/26/24 1251)  Date of last solid: 03/25/24 (03/26/24 1251)  Time of last solid: 1900 (03/26/24 1251)  Food/liquid description: black coffee (03/26/24 1251) BP: 135/79 (03/26/24 1305) Heart Rate: 61 (03/26/24 1305) Resp: 16 (03/26/24 1305) Temp: 97.8 F (36.6 C) (03/26/24 1305) SpO2: 100 % (03/26/24 1305) Weight: 89.1 kg (196 lb 8 oz) (03/26/24 1305) BMI (Calculated): 29 (03/26/24 1305)   "

## 2024-03-26 NOTE — Interval H&P Note (Signed)
 H&P reviewed, patient examined and there is NO CHANGE to the patient status.

## 2024-03-27 ENCOUNTER — Inpatient Hospital Stay (HOSPITAL_COMMUNITY): Admission: EM | Disposition: A | Payer: Self-pay | Source: Home / Self Care | Attending: Cardiology

## 2024-03-27 ENCOUNTER — Other Ambulatory Visit (HOSPITAL_COMMUNITY): Payer: Self-pay

## 2024-03-27 ENCOUNTER — Encounter (HOSPITAL_COMMUNITY): Payer: Self-pay | Admitting: Cardiology

## 2024-03-27 ENCOUNTER — Telehealth (HOSPITAL_COMMUNITY): Payer: Self-pay | Admitting: Pharmacy Technician

## 2024-03-27 ENCOUNTER — Inpatient Hospital Stay (HOSPITAL_COMMUNITY)
Admission: EM | Admit: 2024-03-27 | Discharge: 2024-03-28 | DRG: 250 | Disposition: A | Discharge status: Home / Self Care | Payer: Self-pay | Attending: Cardiology | Admitting: Cardiology

## 2024-03-27 ENCOUNTER — Other Ambulatory Visit: Payer: Self-pay

## 2024-03-27 DIAGNOSIS — Z955 Presence of coronary angioplasty implant and graft: Secondary | ICD-10-CM

## 2024-03-27 DIAGNOSIS — T82867A Thrombosis of cardiac prosthetic devices, implants and grafts, initial encounter: Principal | ICD-10-CM | POA: Diagnosis present

## 2024-03-27 DIAGNOSIS — K219 Gastro-esophageal reflux disease without esophagitis: Secondary | ICD-10-CM | POA: Diagnosis present

## 2024-03-27 DIAGNOSIS — E119 Type 2 diabetes mellitus without complications: Secondary | ICD-10-CM

## 2024-03-27 DIAGNOSIS — I2510 Atherosclerotic heart disease of native coronary artery without angina pectoris: Secondary | ICD-10-CM | POA: Diagnosis present

## 2024-03-27 DIAGNOSIS — Z79899 Other long term (current) drug therapy: Secondary | ICD-10-CM | POA: Diagnosis not present

## 2024-03-27 DIAGNOSIS — I213 ST elevation (STEMI) myocardial infarction of unspecified site: Secondary | ICD-10-CM | POA: Insufficient documentation

## 2024-03-27 DIAGNOSIS — I2102 ST elevation (STEMI) myocardial infarction involving left anterior descending coronary artery: Principal | ICD-10-CM

## 2024-03-27 DIAGNOSIS — I252 Old myocardial infarction: Secondary | ICD-10-CM

## 2024-03-27 DIAGNOSIS — Z7982 Long term (current) use of aspirin: Secondary | ICD-10-CM | POA: Diagnosis not present

## 2024-03-27 DIAGNOSIS — Y831 Surgical operation with implant of artificial internal device as the cause of abnormal reaction of the patient, or of later complication, without mention of misadventure at the time of the procedure: Secondary | ICD-10-CM | POA: Diagnosis present

## 2024-03-27 DIAGNOSIS — I21A9 Other myocardial infarction type: Secondary | ICD-10-CM | POA: Diagnosis present

## 2024-03-27 DIAGNOSIS — E7800 Pure hypercholesterolemia, unspecified: Secondary | ICD-10-CM | POA: Diagnosis present

## 2024-03-27 DIAGNOSIS — R0789 Other chest pain: Secondary | ICD-10-CM | POA: Diagnosis present

## 2024-03-27 HISTORY — PX: LEFT HEART CATH AND CORONARY ANGIOGRAPHY: CATH118249

## 2024-03-27 HISTORY — PX: CORONARY/GRAFT ACUTE MI REVASCULARIZATION: CATH118305

## 2024-03-27 HISTORY — PX: CORONARY ULTRASOUND/IVUS: CATH118244

## 2024-03-27 LAB — POCT ACTIVATED CLOTTING TIME
Activated Clotting Time: 209 s
Activated Clotting Time: 342 s
Activated Clotting Time: 168 s

## 2024-03-27 LAB — APTT: aPTT: 23 s — ABNORMAL LOW (ref 24–36)

## 2024-03-27 LAB — COMPREHENSIVE METABOLIC PANEL WITH GFR
ALT: 45 U/L — ABNORMAL HIGH (ref 0–44)
AST: 35 U/L (ref 15–41)
Albumin: 4.4 g/dL (ref 3.5–5.0)
Alkaline Phosphatase: 87 U/L (ref 38–126)
Anion gap: 11 (ref 5–15)
BUN: 12 mg/dL (ref 6–20)
CO2: 21 mmol/L — ABNORMAL LOW (ref 22–32)
Calcium: 9.3 mg/dL (ref 8.9–10.3)
Chloride: 105 mmol/L (ref 98–111)
Creatinine, Ser: 1.01 mg/dL (ref 0.61–1.24)
GFR, Estimated: >60 mL/min
Glucose, Bld: 200 mg/dL — ABNORMAL HIGH (ref 70–99)
Potassium: 4.4 mmol/L (ref 3.5–5.1)
Sodium: 137 mmol/L (ref 135–145)
Total Bilirubin: 0.4 mg/dL (ref 0.0–1.2)
Total Protein: 7 g/dL (ref 6.5–8.1)

## 2024-03-27 LAB — CBC WITH DIFFERENTIAL/PLATELET
Abs Immature Granulocytes: 0.11 K/uL — ABNORMAL HIGH (ref 0.00–0.07)
Basophils Absolute: 0 K/uL (ref 0.0–0.1)
Basophils Relative: 0 %
Eosinophils Absolute: 0 K/uL (ref 0.0–0.5)
Eosinophils Relative: 0 %
HCT: 41 % (ref 39.0–52.0)
Hemoglobin: 13.9 g/dL (ref 13.0–17.0)
Immature Granulocytes: 1 %
Lymphocytes Relative: 8 %
Lymphs Abs: 1.5 K/uL (ref 0.7–4.0)
MCH: 29.3 pg (ref 26.0–34.0)
MCHC: 33.9 g/dL (ref 30.0–36.0)
MCV: 86.3 fL (ref 80.0–100.0)
Monocytes Absolute: 1.3 K/uL — ABNORMAL HIGH (ref 0.1–1.0)
Monocytes Relative: 6 %
Neutro Abs: 17.3 K/uL — ABNORMAL HIGH (ref 1.7–7.7)
Neutrophils Relative %: 85 %
Platelets: 345 K/uL (ref 150–400)
RBC: 4.75 MIL/uL (ref 4.22–5.81)
RDW: 13.5 % (ref 11.5–15.5)
WBC: 20.3 K/uL — ABNORMAL HIGH (ref 4.0–10.5)
nRBC: 0 % (ref 0.0–0.2)

## 2024-03-27 LAB — LIPID PANEL
Cholesterol: 162 mg/dL (ref 0–200)
HDL: 63 mg/dL
LDL Cholesterol: 78 mg/dL (ref 0–99)
Total CHOL/HDL Ratio: 2.6 ratio
Triglycerides: 104 mg/dL
VLDL: 21 mg/dL (ref 0–40)

## 2024-03-27 LAB — POCT I-STAT, CHEM 8
BUN: 12 mg/dL (ref 6–20)
Calcium, Ion: 1.23 mmol/L (ref 1.15–1.40)
Chloride: 107 mmol/L (ref 98–111)
Creatinine, Ser: 1 mg/dL (ref 0.61–1.24)
Glucose, Bld: 204 mg/dL — ABNORMAL HIGH (ref 70–99)
HCT: 41 % (ref 39.0–52.0)
Hemoglobin: 13.9 g/dL (ref 13.0–17.0)
Potassium: 4.2 mmol/L (ref 3.5–5.1)
Sodium: 138 mmol/L (ref 135–145)
TCO2: 20 mmol/L — ABNORMAL LOW (ref 22–32)

## 2024-03-27 LAB — HEMOGLOBIN A1C
Hgb A1c MFr Bld: 6.1 % — ABNORMAL HIGH (ref 4.8–5.6)
Mean Plasma Glucose: 128.37 mg/dL

## 2024-03-27 LAB — CG4 I-STAT (LACTIC ACID): Lactic Acid, Venous: 1.8 mmol/L (ref 0.5–1.9)

## 2024-03-27 LAB — TROPONIN T, HIGH SENSITIVITY
Troponin T High Sensitivity: 26 ng/L — ABNORMAL HIGH (ref 0–19)
Troponin T High Sensitivity: 142 ng/L (ref 0–19)

## 2024-03-27 LAB — HIV ANTIBODY (ROUTINE TESTING W REFLEX): HIV Screen 4th Generation wRfx: NONREACTIVE

## 2024-03-27 LAB — MRSA NEXT GEN BY PCR, NASAL: MRSA by PCR Next Gen: NOT DETECTED

## 2024-03-27 LAB — PROTIME-INR
INR: 1 (ref 0.8–1.2)
Prothrombin Time: 14 s (ref 11.4–15.2)

## 2024-03-27 SURGERY — CORONARY/GRAFT ACUTE MI REVASCULARIZATION
Anesthesia: LOCAL

## 2024-03-27 MED ORDER — LIDOCAINE HCL (PF) 1 % IJ SOLN
INTRAMUSCULAR | Status: AC
  Filled 2024-03-27: qty 30

## 2024-03-27 MED ORDER — HEPARIN (PORCINE) IN NACL 1000-0.9 UT/500ML-% IV SOLN
INTRAVENOUS | Status: DC | PRN
  Administered 2024-03-27 (×3): 500 mL

## 2024-03-27 MED ORDER — HEPARIN SODIUM (PORCINE) 1000 UNIT/ML IJ SOLN
INTRAMUSCULAR | Status: DC | PRN
  Administered 2024-03-27: 9000 [IU] via INTRAVENOUS

## 2024-03-27 MED ORDER — METOPROLOL SUCCINATE ER 25 MG PO TB24
25.0000 mg | ORAL_TABLET | Freq: Every day | ORAL | Status: DC
  Administered 2024-03-28: 25 mg via ORAL
  Filled 2024-03-27: qty 1

## 2024-03-27 MED ORDER — NITROGLYCERIN 1 MG/10 ML FOR IR/CATH LAB
INTRA_ARTERIAL | Status: AC
  Filled 2024-03-27: qty 10

## 2024-03-27 MED ORDER — ACETAMINOPHEN 325 MG PO TABS: 650.0000 mg | ORAL_TABLET | ORAL | Status: DC | PRN

## 2024-03-27 MED ORDER — METHOCARBAMOL 500 MG PO TABS
500.0000 mg | ORAL_TABLET | Freq: Four times a day (QID) | ORAL | Status: DC | PRN
  Administered 2024-03-27 – 2024-03-28 (×4): 500 mg via ORAL
  Filled 2024-03-27 (×4): qty 1

## 2024-03-27 MED ORDER — TICAGRELOR 90 MG PO TABS
90.0000 mg | ORAL_TABLET | Freq: Two times a day (BID) | ORAL | Status: DC
  Administered 2024-03-27 – 2024-03-28 (×2): 90 mg via ORAL
  Filled 2024-03-27 (×2): qty 1

## 2024-03-27 MED ORDER — NITROGLYCERIN 0.4 MG SL SUBL: 0.4000 mg | SUBLINGUAL_TABLET | SUBLINGUAL | Status: DC | PRN

## 2024-03-27 MED ORDER — SODIUM CHLORIDE 0.9 % IV SOLN: 250.0000 mL | INTRAVENOUS | Status: DC | PRN

## 2024-03-27 MED ORDER — ORAL CARE MOUTH RINSE: 15.0000 mL | OROMUCOSAL | Status: DC | PRN

## 2024-03-27 MED ORDER — VERAPAMIL HCL 2.5 MG/ML IV SOLN
INTRAVENOUS | Status: AC
  Filled 2024-03-27: qty 2

## 2024-03-27 MED ORDER — SODIUM CHLORIDE 0.9% FLUSH
3.0000 mL | Freq: Two times a day (BID) | INTRAVENOUS | Status: DC
  Administered 2024-03-27 (×2): 3 mL via INTRAVENOUS

## 2024-03-27 MED ORDER — ATORVASTATIN CALCIUM 80 MG PO TABS
80.0000 mg | ORAL_TABLET | Freq: Every day | ORAL | Status: DC
  Administered 2024-03-27: 80 mg via ORAL
  Filled 2024-03-27 (×2): qty 1

## 2024-03-27 MED ORDER — LIDOCAINE HCL (PF) 1 % IJ SOLN
INTRAMUSCULAR | Status: DC | PRN
  Administered 2024-03-27: 10 mL via INTRADERMAL

## 2024-03-27 MED ORDER — HYDRALAZINE HCL 20 MG/ML IJ SOLN: 10.0000 mg | INTRAMUSCULAR | Status: AC | PRN

## 2024-03-27 MED ORDER — ASPIRIN 81 MG PO TBEC
81.0000 mg | DELAYED_RELEASE_TABLET | Freq: Every day | ORAL | Status: DC
  Administered 2024-03-28: 81 mg via ORAL
  Filled 2024-03-27: qty 1

## 2024-03-27 MED ORDER — TICAGRELOR 90 MG PO TABS
ORAL_TABLET | ORAL | Status: AC
  Filled 2024-03-27: qty 1

## 2024-03-27 MED ORDER — TICAGRELOR 90 MG PO TABS
ORAL_TABLET | ORAL | Status: DC | PRN
  Administered 2024-03-27: 180 mg via ORAL

## 2024-03-27 MED ORDER — SODIUM CHLORIDE 0.9 % IV SOLN
INTRAVENOUS | Status: DC | PRN
  Administered 2024-03-27: 10 mL/h via INTRAVENOUS

## 2024-03-27 MED ORDER — HEPARIN SODIUM (PORCINE) 1000 UNIT/ML IJ SOLN
INTRAMUSCULAR | Status: AC
  Filled 2024-03-27: qty 10

## 2024-03-27 MED ORDER — IOHEXOL 350 MG/ML SOLN
INTRAVENOUS | Status: DC | PRN
  Administered 2024-03-27: 135 mL

## 2024-03-27 MED ORDER — ONDANSETRON HCL 4 MG/2ML IJ SOLN: 4.0000 mg | Freq: Four times a day (QID) | INTRAMUSCULAR | Status: DC | PRN

## 2024-03-27 MED ORDER — ENOXAPARIN SODIUM 40 MG/0.4ML IJ SOSY: 40.0000 mg | PREFILLED_SYRINGE | INTRAMUSCULAR | Status: DC

## 2024-03-27 MED ORDER — OXYCODONE HCL 5 MG PO TABS
5.0000 mg | ORAL_TABLET | ORAL | Status: DC | PRN
  Administered 2024-03-27 – 2024-03-28 (×5): 5 mg via ORAL
  Filled 2024-03-27 (×5): qty 1

## 2024-03-27 MED ORDER — FREE WATER
500.0000 mL | Freq: Once | Status: AC
  Administered 2024-03-27: 500 mL via ORAL

## 2024-03-27 MED ORDER — TICAGRELOR 90 MG PO TABS
ORAL_TABLET | ORAL | Status: AC
  Filled 2024-03-27: qty 2

## 2024-03-27 MED ORDER — SODIUM CHLORIDE 0.9% FLUSH: 3.0000 mL | INTRAVENOUS | Status: DC | PRN

## 2024-03-27 SURGICAL SUPPLY — 19 items
BALLOON EMERGE MR 3.0X15 (BALLOONS) IMPLANT
BALLOON ~~LOC~~ EMERGE MR 3.5X15 (BALLOONS) IMPLANT
BALLOON ~~LOC~~ EUPHORA RX 3.75X12 (BALLOONS) IMPLANT
CATH INFINITI 5 FR 3DRC (CATHETERS) IMPLANT
CATH INFINITI 5FR MULTPACK ANG (CATHETERS) IMPLANT
CATH IV OPTICROSS HD 3FRX135 (CATHETERS) IMPLANT
CATH VISTA GUIDE 6FR XBLD 3.5 (CATHETERS) IMPLANT
DRAPE IVUS SLED (BAG) IMPLANT
ELECT DEFIB PAD ADLT CADENCE (PAD) IMPLANT
GLIDESHEATH SLEND SS 6F .021 (SHEATH) IMPLANT
GUIDEWIRE INQWIRE 1.5J.035X260 (WIRE) IMPLANT
KIT ENCORE 26 ADVANTAGE (KITS) IMPLANT
KIT MICROPUNCTURE NIT STIFF (SHEATH) IMPLANT
KIT NAMIC PS PRESSURIZED FLUID (KITS) IMPLANT
PACK CARDIAC CATHETERIZATION (CUSTOM PROCEDURE TRAY) ×1 IMPLANT
SHEATH PINNACLE 6F 10CM (SHEATH) IMPLANT
SHEATH PROBE COVER 6X72 (BAG) IMPLANT
TUBING CIL FLEX 10 FLL-RA (TUBING) IMPLANT
WIRE ASAHI PROWATER 180CM (WIRE) IMPLANT

## 2024-03-27 NOTE — Telephone Encounter (Signed)
 Patient Product/process Development Scientist completed.    The patient is insured through [facility]. Patient has [employer], may use a copay card, and/or apply for patient assistance if available.    Ran test claim for ticagrelor  (Brilinta ) 90 mg and the current 30 day co-pay is $10.00.   This test claim was processed through [facility]- copay amounts may vary at other pharmacies due to pharmacy/plan contracts, or as the patient moves through the different stages of their insurance plan.     Reyes Sharps, CPHT Pharmacy Technician Patient Advocate Specialist Lead [facility] Patient Advocate Team Direct Number: [phone]  Fax: [phone]

## 2024-03-27 NOTE — H&P (Signed)
 "  Cardiology Admission History and Physical   Patient ID: Lance Boyer MRN: [id number]; DOB: [birth year]   Admission date: 03/27/2024  PCP:  Nanci Senior, MD    HeartCare Providers Cardiologist:  None   - Followed by [facility]   Chief Complaint:  STEMI   Patient Profile: Lance Boyer is a 58 y.o. male with a past medical history of coronary artery disease, history of NSVT, recent shoulder surgery who is being seen 03/27/2024 for the evaluation of STEMI.  History of Present Illness: Lance Boyer is a 58 year old male with above medical history.  Patient has been followed by [facility] health cardiology in the past.  He previously had NSTEMI in 2017 and received DES to the LAD and PDA.  In 02/2020, patient again had NSTEMI.  Found to have total LAD occlusion proximal to the original stent.  Underwent DES placement to the LAD.  Patient was seen by his [facility] cardiologist on 02/28/2024 for preoperative evaluation prior to shoulder surgery.  Patient underwent an echo stress test on 03/10/2024.  EF 50-55% at rest.  While on the treadmill, patient had no chest pain and had appropriate heart rate response to exercise.  Stress EKG showed equivocal ST segment changes.  Poststress, LV systolic function was normal.  Overall study was negative.  Patient was cleared to undergo shoulder surgery.  He was instructed to hold his Plavix  prior to shoulder surgery.  Underwent surgery yesterday.  Today, patient developed chest pain that was an 8/10.  With EMS, EKG concerning for STEMI.  Patient was taken to the Cath Lab emergently.  Past Medical History:  Diagnosis Date   Coronary artery disease    a. PCI 11/02/2015 90% mid LAD s/p DES, 75% Prox RCA s/p DES   GERD (gastroesophageal reflux disease)    High cholesterol 10/08/2014   Myocardial infarction (HCC) 11/01/2015   Past Surgical History:  Procedure Laterality Date   CARDIAC CATHETERIZATION N/A 11/02/2015   Procedure: Left Heart Cath and Coronary  Angiography;  Surgeon: Candyce GORMAN Reek, MD;  Location: [facility] INVASIVE CV LAB;  Service: Cardiovascular;  Laterality: N/A;   CARDIAC CATHETERIZATION N/A 11/02/2015   Procedure: Coronary Stent Intervention;  Surgeon: Candyce GORMAN Reek, MD;  Location: [facility] INVASIVE CV LAB;  Service: Cardiovascular;  Laterality: N/A;     Medications Prior to Admission: Prior to Admission medications  Medication Sig Start Date End Date Taking? Authorizing Provider  aspirin  EC 81 MG tablet Take 1 tablet (81 mg total) by mouth daily. 01/04/17   Croitoru, Mihai, MD  atorvastatin  (LIPITOR) 80 MG tablet Take 1 tablet (80 mg total) by mouth daily at 6 PM. 08/07/17   Kilroy, Luke K, PA-C  metoprolol  succinate (TOPROL  XL) 25 MG 24 hr tablet Take 1 tablet (25 mg total) by mouth daily as needed. 08/07/17   Kilroy, Luke K, PA-C  nitroGLYCERIN  (NITROSTAT ) 0.4 MG SL tablet Place 1 tablet (0.4 mg total) under the tongue every 5 (five) minutes x 3 doses as needed for chest pain. 08/07/17   Kilroy, Luke K, PA-C     Allergies:   Allergies[1]  Social History:   Social History   Socioeconomic History   Marital status: Married    Spouse name: Not on file   Number of children: Not on file   Years of education: Not on file   Highest education level: Not on file  Occupational History   Not on file  Tobacco Use   Smoking status: Never   Smokeless tobacco: Never  Substance and Sexual Activity   Alcohol use: Yes    Comment: 11/02/2015 might have a few drinks a few times/year   Drug use: No   Sexual activity: Yes  Other Topics Concern   Not on file  Social History Narrative   Not on file   Social Drivers of Health   Tobacco Use: Low Risk (03/26/2024)   Received from [facility]   Patient History    Smoking Tobacco Use: Never    Smokeless Tobacco Use: Never    Passive Exposure: Not on file  Financial Resource Strain: Low Risk (01/08/2024)   Received from [facility]   Overall Financial Resource Strain (CARDIA)    How  hard is it for you to pay for the very basics like food, housing, medical care, and heating?: Not hard at all  Food Insecurity: No Food Insecurity (01/08/2024)   Received from [facility]   Epic    Within the past 12 months, you worried that your food would run out before you got the money to buy more.: Never true    Within the past 12 months, the food you bought just didn't last and you didn't have money to get more.: Never true  Transportation Needs: No Transportation Needs (01/08/2024)   Received from [facility]    In the past 12 months, has lack of transportation kept you from medical appointments or from getting medications?: No    In the past 12 months, has lack of transportation kept you from meetings, work, or from getting things needed for daily living?: No  Physical Activity: Insufficiently Active (01/08/2024)   Received from [facility]   Exercise Vital Sign    On average, how many days per week do you engage in moderate to strenuous exercise (like a brisk walk)?: 2 days    On average, how many minutes do you engage in exercise at this level?: 20 min  Stress: No Stress Concern Present (03/26/2024)   Received from [facility] - Occupational Stress Questionnaire    Do you feel stress - tense, restless, nervous, or anxious, or unable to sleep at night because your mind is troubled all the time - these days?: Not at all  Social Connections: Socially Integrated (01/08/2024)   Received from [facility]   Social Network    How would you rate your social network (family, work, friends)?: Good participation with social networks  Intimate Partner Violence: Not At Risk (03/26/2024)   Received from [facility]   HITS    Over the last 12 months how often did your partner physically hurt you?: Never    Over the last 12 months how often did your partner insult you or talk down to you?: Never    Over the last 12 months how often did  your partner threaten you with physical harm?: Never    Over the last 12 months how often did your partner scream or curse at you?: Never  Depression (PHQ2-9): Not on file  Alcohol Screen: Not on file  Housing: Low Risk (01/08/2024)   Received from [facility]    In the last 12 months, was there a time when you were not able to pay the mortgage or rent on time?: No    In the past 12 months, how many times have you moved where you were living?: 0    At any time in the past 12 months, were  you homeless or living in a shelter (including now)?: No  Utilities: Not At Risk (01/08/2024)   Received from [facility]    In the past 12 months has the electric, gas, oil, or water company threatened to shut off services in your home?: No  Health Literacy: Not on file     Family History:   The patient's family history is not on file.    ROS:  Please see the history of present illness.  All other ROS reviewed and negative.     Physical Exam/Data: Vitals:   03/27/24 1155  SpO2: 98%   No intake or output data in the 24 hours ending 03/27/24 1244    08/07/2017    8:30 AM 06/05/2016    8:58 AM 05/10/2016    8:02 AM  Last 3 Weights  Weight (lbs) 178 lb 170 lb 9.6 oz 168 lb  Weight (kg) 80.74 kg 77.384 kg 76.204 kg     There is no height or weight on file to calculate BMI.  Physical exam per MD   Laboratory Data: High Sensitivity Troponin:  No results for input(s): TROPONINIHS in the last 720 hours.  Recent Labs  Lab 03/27/24 1205  TRNPT 26*        Chemistry Recent Labs  Lab 03/27/24 1205  NA 137  K 4.4  CL 105  CO2 21*  GLUCOSE 200*  BUN 12  CREATININE 1.01  CALCIUM  9.3  GFRNONAA >60  ANIONGAP 11    Recent Labs  Lab 03/27/24 1205  PROT 7.0  ALBUMIN 4.4  AST 35  ALT 45*  ALKPHOS 87  BILITOT 0.4   Lipids  Recent Labs  Lab 03/27/24 1213  CHOL 162  TRIG 104  HDL 63  LDLCALC 78  CHOLHDL 2.6   Hematology Recent Labs  Lab 03/27/24 1205  WBC  20.3*  RBC 4.75  HGB 13.9  HCT 41.0  MCV 86.3  MCH 29.3  MCHC 33.9  RDW 13.5  PLT 345   Thyroid No results for input(s): TSH, FREET4 in the last 168 hours. BNPNo results for input(s): BNP, PROBNP in the last 168 hours.  DDimer No results for input(s): DDIMER in the last 168 hours.  Radiology/Studies:  No results found.   Assessment and Plan:  STEMI  - Patient has known history of CAD.  Previously had DES to LAD and PDA in 2017.  Later had a second DES placed to the mid LAD in 2021. - Followed by [facility].  Recently had a negative stress echo on 03/10/2024 - Patient underwent shoulder surgery yesterday.  Was instructed to hold his Plavix  perioperatively.  Today, developed chest pain that was somewhat relieved with SL nitroglycerin .  EKG concerning for inferior STEMI  - Patient taken emergently to the cath lab about 45 minutes after his chest pain started   Risk Assessment/Risk Scores:  TIMI Risk Score for ST  Elevation MI:   The patient's TIMI risk score is 1, which indicates a 1.6% risk of all cause mortality at 30 days.    Severity of Illness: The appropriate patient status for this patient is INPATIENT. Inpatient status is judged to be reasonable and necessary in order to provide the required intensity of service to ensure the patient's safety. The patient's presenting symptoms, physical exam findings, and initial radiographic and laboratory data in the context of their chronic comorbidities is felt to place them at high risk for further clinical deterioration. Furthermore, it is not anticipated that the  patient will be medically stable for discharge from the hospital within 2 midnights of admission.   * I certify that at the point of admission it is my clinical judgment that the patient will require inpatient hospital care spanning beyond 2 midnights from the point of admission due to high intensity of service, high risk for further deterioration and high frequency of  surveillance required.*  For questions or updates, please contact [facility] Please consult [email or web address] for contact info under      Signed, Rollo FABIENE Louder, PA-C  03/27/2024 12:44 PM      [1] No Known Allergies  "

## 2024-03-28 ENCOUNTER — Inpatient Hospital Stay (HOSPITAL_COMMUNITY)

## 2024-03-28 ENCOUNTER — Other Ambulatory Visit (HOSPITAL_COMMUNITY): Payer: Self-pay

## 2024-03-28 DIAGNOSIS — I2102 ST elevation (STEMI) myocardial infarction involving left anterior descending coronary artery: Secondary | ICD-10-CM | POA: Diagnosis not present

## 2024-03-28 DIAGNOSIS — I213 ST elevation (STEMI) myocardial infarction of unspecified site: Secondary | ICD-10-CM | POA: Insufficient documentation

## 2024-03-28 LAB — BASIC METABOLIC PANEL WITH GFR
Anion gap: 11 (ref 5–15)
BUN: 12 mg/dL (ref 6–20)
CO2: 23 mmol/L (ref 22–32)
Calcium: 8.8 mg/dL — ABNORMAL LOW (ref 8.9–10.3)
Chloride: 105 mmol/L (ref 98–111)
Creatinine, Ser: 0.97 mg/dL (ref 0.61–1.24)
GFR, Estimated: >60 mL/min
Glucose, Bld: 138 mg/dL — ABNORMAL HIGH (ref 70–99)
Potassium: 3.8 mmol/L (ref 3.5–5.1)
Sodium: 138 mmol/L (ref 135–145)

## 2024-03-28 LAB — CBC
HCT: 39 % (ref 39.0–52.0)
Hemoglobin: 12.9 g/dL — ABNORMAL LOW (ref 13.0–17.0)
MCH: 28.7 pg (ref 26.0–34.0)
MCHC: 33.1 g/dL (ref 30.0–36.0)
MCV: 86.9 fL (ref 80.0–100.0)
Platelets: 296 K/uL (ref 150–400)
RBC: 4.49 MIL/uL (ref 4.22–5.81)
RDW: 13.9 % (ref 11.5–15.5)
WBC: 13.5 K/uL — ABNORMAL HIGH (ref 4.0–10.5)
nRBC: 0 % (ref 0.0–0.2)

## 2024-03-28 LAB — ECHOCARDIOGRAM COMPLETE
Area-P 1/2: 3 cm2
Calc EF: 54.3 %
Height: 69 in
S' Lateral: 3.2 cm
Single Plane A2C EF: 51.1 %
Single Plane A4C EF: 56.7 %
Weight: 3142.88 [oz_av]

## 2024-03-28 MED ORDER — PERFLUTREN LIPID MICROSPHERE
1.0000 mL | INTRAVENOUS | Status: AC | PRN
  Administered 2024-03-28: 3 mL via INTRAVENOUS

## 2024-03-28 MED ORDER — TICAGRELOR 90 MG PO TABS
90.0000 mg | ORAL_TABLET | Freq: Two times a day (BID) | ORAL | 11 refills | Status: AC
  Filled 2024-03-28: qty 60, 30d supply, fill #0

## 2024-03-28 MED ORDER — NITROGLYCERIN 0.4 MG SL SUBL
0.4000 mg | SUBLINGUAL_TABLET | SUBLINGUAL | 1 refills | Status: DC | PRN
  Filled 2024-03-28: qty 25, 30d supply, fill #0

## 2024-03-28 NOTE — Plan of Care (Signed)
" °  Problem: Education: Goal: Knowledge of General Education information will improve Description: Including pain rating scale, medication(s)/side effects and non-pharmacologic comfort measures Outcome: Progressing   Problem: Clinical Measurements: Goal: Ability to maintain clinical measurements within normal limits will improve Outcome: Progressing Goal: Will remain free from infection Outcome: Progressing Goal: Diagnostic test results will improve Outcome: Progressing Goal: Respiratory complications will improve Outcome: Progressing Goal: Cardiovascular complication will be avoided Outcome: Progressing   Problem: Activity: Goal: Risk for activity intolerance will decrease Outcome: Progressing   Problem: Nutrition: Goal: Adequate nutrition will be maintained Outcome: Progressing   Problem: Coping: Goal: Level of anxiety will decrease Outcome: Progressing   Problem: Elimination: Goal: Will not experience complications related to bowel motility Outcome: Progressing Goal: Will not experience complications related to urinary retention Outcome: Progressing   Problem: Pain Managment: Goal: General experience of comfort will improve and/or be controlled Outcome: Progressing   Problem: Safety: Goal: Ability to remain free from injury will improve Outcome: Progressing   Problem: Skin Integrity: Goal: Risk for impaired skin integrity will decrease Outcome: Progressing   Problem: Education: Goal: Understanding of cardiac disease, CV risk reduction, and recovery process will improve Outcome: Progressing   Problem: Activity: Goal: Ability to tolerate increased activity will improve Outcome: Progressing   Problem: Cardiac: Goal: Ability to achieve and maintain adequate cardiovascular perfusion will improve Outcome: Progressing   Problem: Education: Goal: Understanding of CV disease, CV risk reduction, and recovery process will improve Outcome: Progressing   Problem:  Activity: Goal: Ability to return to baseline activity level will improve Outcome: Progressing   Problem: Cardiovascular: Goal: Ability to achieve and maintain adequate cardiovascular perfusion will improve Outcome: Progressing Goal: Vascular access site(s) Level 0-1 will be maintained Outcome: Progressing   "

## 2024-03-28 NOTE — Progress Notes (Signed)
 CARDIAC REHAB PHASE I   Post MI/stent education including restrictions, risk factors, exercise guidelines, antiplatelet therapy importance, MI booklet, NTG use, heart healthy diet, and CRP2 reviewed. All questions and concerns addressed. Will refer to [facility] for CRP2.   Fairy JONETTA Music, RN BSN 03/28/2024 9:49 AM [phone]

## 2024-03-28 NOTE — Discharge Summary (Signed)
 " Discharge Summary   Patient ID: Lance Boyer MRN: [id number]; DOB: [birth year]  Admit date: 03/27/2024 Discharge date: 03/28/2024  PCP:  Nanci Senior, MD   [facility] Providers Cardiologist: [facility]  Discharge Diagnoses  Principal Problem:   Acute ST elevation myocardial infarction (STEMI) due to occlusion of left anterior descending (LAD) coronary artery [facility])   Diagnostic Studies/Procedures   ECHO: Performed, results pending  CATH plus PCI: 03/27/2024   Prox LAD to Mid LAD lesion is 80% stenosed.   Mid RCA lesion is 40% stenosed.   Previously placed RPDA stent of unknown type is  widely patent.   Balloon angioplasty was performed using a BALLOON [place] EUPHORA RX [id number].   Post intervention, there is a 0% residual stenosis.   LV end diastolic pressure is normal.   Recommend uninterrupted dual antiplatelet therapy with Aspirin  81mg  daily and Ticagrelor  90mg  twice daily for a minimum of 12 months (ACS-Class I recommendation).   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 This appears to be related to stent underexpansion and interruption of antiplatelet therapy Normal LVEDP 3. Successful PCI of the proximal LAD with [place] balloon to fully expand the prior stents with IVUS guidance Diagnostic Dominance: Right  Intervention   _____________   History of Present Illness   Lance Boyer is a 58 y.o. male with NSTEMI in 2017 and received DES to the LAD and PDA. In 02/2020, patient again had NSTEMI. Found to have total LAD occlusion proximal to the original stent. Underwent DES placement to the LAD.   He had an echo stress test on 03/10/2024.  EF 50-55% at rest.  While on the treadmill, patient had no chest pain and had appropriate heart rate response to exercise. Stress EKG showed equivocal ST segment changes. Poststress, LV systolic function was normal. Overall study was negative.   The stress test was performed in anticipation of shoulder  surgery.  He was cleared for surgery.  He held his Plavix  further procedure and had surgery on 12/18.   On 12/19, he developed 8/10 chest pain, ECG concerning for STEMI and he was taken emergently to the Cath Lab.   Hospital Course   Consultants: None  Procedure results are above, he tolerated the procedure well.  On 12/20, he was seen by Dr. Doreatha and all data were reviewed.  His Plavix  has been discontinued    Did the patient have an acute coronary syndrome (MI, NSTEMI, STEMI, etc) this admission?:  Yes                               AHA/ACC ACS Clinical Performance & Quality Measures: Aspirin  prescribed? - Yes ADP Receptor Inhibitor (Plavix /Clopidogrel , Brilinta /Ticagrelor  or Effient/Prasugrel) prescribed (includes medically managed patients)? - Yes Beta Blocker prescribed? - Yes High Intensity Statin (Lipitor  40-80mg  or Crestor 20-40mg ) prescribed? - Yes EF assessed during THIS hospitalization? - Yes For EF <40%, was ACEI/ARB prescribed? - Not Applicable (EF >/= 40%) For EF <40%, Aldosterone Antagonist (Spironolactone or Eplerenone) prescribed? - Not Applicable (EF >/= 40%) Cardiac Rehab Phase II ordered (including medically managed patients)? - Yes     _____________  Discharge Vitals Blood pressure 119/67, pulse 70, temperature 98.3 F (36.8 C), temperature source Oral, resp. rate 14, height 5' 9 (1.753 m), weight 89.1 kg, SpO2 92%.  Filed Weights   03/27/24 1400  Weight: 89.1 kg    Labs & Radiologic Studies  CBC Recent Labs  03/27/24 1205 03/27/24 1209 03/28/24 0219  WBC 20.3*  --  13.5*  NEUTROABS 17.3*  --   --   HGB 13.9 13.9 12.9*  HCT 41.0 41.0 39.0  MCV 86.3  --  86.9  PLT 345  --  296   Basic Metabolic Panel Recent Labs    87/80/74 1205 03/27/24 1209 03/28/24 0219  NA 137 138 138  K 4.4 4.2 3.8  CL 105 107 105  CO2 21*  --  23  GLUCOSE 200* 204* 138*  BUN 12 12 12   CREATININE 1.01 1.00 0.97  CALCIUM  9.3  --  8.8*   Liver Function  Tests Recent Labs    03/27/24 1205  AST 35  ALT 45*  ALKPHOS 87  BILITOT 0.4  PROT 7.0  ALBUMIN 4.4   No results for input(s): LIPASE, AMYLASE in the last 72 hours. High Sensitivity Troponin:     Recent Labs  Lab 03/27/24 1205 03/27/24 1415  TRNPT 26* 142*    BNP Invalid input(s): POCBNP No results for input(s): PROBNP in the last 72 hours.  No results for input(s): BNP in the last 72 hours.  D-Dimer No results for input(s): DDIMER in the last 72 hours. Hemoglobin A1C Recent Labs    03/27/24 1213  HGBA1C 6.1*   Fasting Lipid Panel Recent Labs    03/27/24 1213  CHOL 162  HDL 63  LDLCALC 78  TRIG 104  CHOLHDL 2.6   No results found for: LIPOA  Thyroid Function Tests No results for input(s): TSH, T4TOTAL, T3FREE, THYROIDAB in the last 72 hours.  Invalid input(s): FREET3 _____________  CARDIAC CATHETERIZATION Result Date: 03/27/2024   Prox LAD to Mid LAD lesion is 80% stenosed.   Mid RCA lesion is 40% stenosed.   Previously placed RPDA stent of unknown type is  widely patent.   Balloon angioplasty was performed using a BALLOON [place] EUPHORA RX [id number].   Post intervention, there is a 0% residual stenosis.   LV end diastolic pressure is normal.   Recommend uninterrupted dual antiplatelet therapy with Aspirin  81mg  daily and Ticagrelor  90mg  twice daily for a minimum of 12 months (ACS-Class I recommendation). 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 This appears to be related to stent underexpansion and interruption of antiplatelet therapy Normal LVEDP 3. Successful PCI of the proximal LAD with [place] balloon to fully expand the prior stents with IVUS guidance Plan: DAPT for one year. Assess LV function with Echo. May be a candidate for early DC.   Left Anterior Descending  Prox LAD to Mid LAD lesion is 80% stenosed. The lesion is thrombotic. The lesion was previously treated using a drug eluting stent over 2 years ago.  Previously placed stent displays rethrombosis. Ultrasound (IVUS) was performed. Minimal plaque burden was detected. Undersized proximal LAD stent    Right Coronary Artery  Mid RCA lesion is 40% stenosed.    Right Posterior Descending Artery  Previously placed RPDA stent of unknown type is widely patent.    Intervention   Prox LAD to Mid LAD lesion  Angioplasty  [place] GUIDE 6FR XBLD 3.5 guide catheter was inserted. WIRE ASAHI PROWATER 180CM guidewire used to cross lesion. Balloon angioplasty was performed using a BALLOON [employer] EUPHORA RX [id number]. Maximum pressure: 18 atm. A second ballloon was used, using a standard BALLOON [place] EMERGE MR 3.5X15. Maximum pressure: 20 atm.  Post-Intervention Lesion Assessment  The intervention was successful. Pre-interventional TIMI flow is 3. Post-intervention TIMI flow is  3. No complications occurred at this lesion. Ultrasound (IVUS) was performed on the lesion post PCI. Stent well apposed. Minimum stent area: 8.5 mm.  There is a 0% residual stenosis post intervention.      Disposition Pt is being discharged home today in good condition.  Follow-up Plans & Appointments  Follow-up Information     (Cardiology), [facility]. Schedule an appointment as soon as possible for a visit.   Specialty: Cardiology Why: You need to be seen within 2 weeks.               Discharge Instructions     AMB referral to Phase II Cardiac Rehabilitation   Complete by: As directed    Diagnosis: STEMI   After initial evaluation and assessments completed: Virtual Based Care may be provided alone or in conjunction with Phase 2 Cardiac Rehab based on patient barriers.: Yes   Intensive Cardiac Rehabilitation (ICR) [facility] location only OR Traditional Cardiac Rehabilitation (TCR) *If criteria for ICR are not met will enroll in TCR [facility] only): Yes   Diet Carb Modified   Complete by: As directed    Increase activity slowly   Complete by: As  directed    No dressing needed   Complete by: As directed        Discharge Medications Allergies as of 03/28/2024   No Known Allergies      Medication List     STOP taking these medications    clopidogrel  75 MG tablet Commonly known as: PLAVIX        TAKE these medications    aspirin  EC 81 MG tablet Take 1 tablet (81 mg total) by mouth daily. What changed:  when to take this reasons to take this   atorvastatin  80 MG tablet Commonly known as: LIPITOR  Take 1 tablet (80 mg total) by mouth daily at 6 PM.   ezetimibe 10 MG tablet Commonly known as: ZETIA Take 10 mg by mouth every evening.   methocarbamol  500 MG tablet Commonly known as: ROBAXIN  Take 500 mg by mouth every 6 (six) hours as needed for muscle spasms.   metoprolol  succinate 50 MG 24 hr tablet Commonly known as: TOPROL -XL Take 50 mg by mouth daily. Take with or immediately following a meal. What changed: Another medication with the same name was removed. Continue taking this medication, and follow the directions you see here.   nitroGLYCERIN  0.4 MG SL tablet Commonly known as: NITROSTAT  Place 1 tablet (0.4 mg total) under the tongue every 5 (five) minutes x 3 doses as needed for chest pain.   oxyCODONE  5 MG immediate release tablet Commonly known as: Oxy IR/ROXICODONE  Take 5 mg by mouth every 6 (six) hours as needed for moderate pain (pain score 4-6).   ticagrelor  90 MG Tabs tablet Commonly known as: BRILINTA  Take 1 tablet (90 mg total) by mouth 2 (two) times daily.               Discharge Care Instructions  (From admission, onward)           Start     Ordered   03/28/24 0000  No dressing needed        03/28/24 9178             Outstanding Labs/Studies None  Duration of Discharge Encounter: APP Time: 36 minutes   Signed, Shona Shad, PA-C 03/28/2024, 8:21 AM     "

## 2024-03-28 NOTE — Progress Notes (Signed)
 Echocardiogram 2D Echocardiogram has been performed.  Damien FALCON Aaron Bostwick RDCS 03/28/2024, 8:50 AM

## 2024-03-28 NOTE — TOC Transition Note (Addendum)
 Transition of Care [facility]) - Discharge Note   Patient Details  Name: Lance Boyer MRN: [id number] Date of Birth: 05-19-1965  Transition of Care [facility]) CM/SW Contact:  Marval Gell, RN Phone Number: 03/28/2024, 8:25 AM   Clinical Narrative:     Meds through [facility] pharmacy. Added primary care to AVS for patient to schedule follow up. Cash paying coupon provided to patient, as well as rebate coupon- patient states that he has Psychiatric Nurse.   Final next level of care: Home/Self Care Barriers to Discharge: No Barriers Identified   Patient Goals and CMS Choice            Discharge Placement                       Discharge Plan and Services Additional resources added to the After Visit Summary for                                       Social Drivers of Health (SDOH) Interventions SDOH Screenings   Food Insecurity: No Food Insecurity (03/27/2024)  Housing: Low Risk (03/27/2024)  Transportation Needs: No Transportation Needs (03/27/2024)  Utilities: Not At Risk (03/27/2024)  Financial Resource Strain: Low Risk (01/08/2024)   Received from [facility]  Physical Activity: Insufficiently Active (01/08/2024)   Received from [facility]  Social Connections: Socially Integrated (01/08/2024)   Received from [facility]  Stress: No Stress Concern Present (03/26/2024)   Received from [facility]  Tobacco Use: Low Risk (03/27/2024)     Readmission Risk Interventions     No data to display

## 2024-03-28 NOTE — Discharge Instructions (Signed)
 PLEASE REMEMBER TO BRING ALL OF YOUR MEDICATIONS TO EACH OF YOUR FOLLOW-UP OFFICE VISITS.  PLEASE ATTEND ALL SCHEDULED FOLLOW-UP APPOINTMENTS.   Activity: Increase activity slowly as tolerated. You may shower, but no soaking baths (or swimming) for 1 week. No driving for 1 week. No lifting over 5 lbs for 2 weeks. No sexual activity for 1 week.   You May Return to Work: in 3 weeks (if applicable)  Wound Care: You may wash cath site gently with soap and water . Keep cath site clean and dry. If you notice pain, swelling, bleeding or pus at your cath site, please call [phone]    Cardiac Cath Site Care Refer to this sheet in the next few weeks. These instructions provide you with information on caring for yourself after your procedure. Your caregiver may also give you more specific instructions. Your treatment has been planned according to current medical practices, but problems sometimes occur. Call your caregiver if you have any problems or questions after your procedure. HOME CARE INSTRUCTIONS You may shower 24 hours after the procedure. Remove the bandage (dressing) and gently wash the site with plain soap and water . Gently pat the site dry.  Do not apply powder or lotion to the site.  Do not sit in a bathtub, swimming pool, or whirlpool for 5 to 7 days.  No bending, squatting, or lifting anything over 10 pounds (4.5 kg) as directed by your caregiver.  Inspect the site at least twice daily.  Do not drive home if you are discharged the same day of the procedure. Have someone else drive you.  You may drive 24 hours after the procedure unless otherwise instructed by your caregiver.  What to expect: Any bruising will usually fade within 1 to 2 weeks.  Blood that collects in the tissue (hematoma) may be painful to the touch. It should usually decrease in size and tenderness within 1 to 2 weeks.  SEEK IMMEDIATE MEDICAL CARE IF: You have unusual pain at the site or down the affected limb.  You  have redness, warmth, swelling, or pain at the site.  You have drainage (other than a small amount of blood on the dressing).  You have chills.  You have a fever or persistent symptoms for more than 72 hours.  You have a fever and your symptoms suddenly get worse.  Your leg becomes pale, cool, tingly, or numb.  You have heavy bleeding from the site. Hold pressure on the site.

## 2024-03-29 ENCOUNTER — Encounter (HOSPITAL_COMMUNITY): Payer: Self-pay | Admitting: Cardiology

## 2024-03-30 ENCOUNTER — Telehealth (HOSPITAL_COMMUNITY): Payer: Self-pay

## 2024-03-30 LAB — LIPOPROTEIN A (LPA): Lipoprotein (a): 164.9 nmol/L — ABNORMAL HIGH

## 2024-03-30 MED FILL — Verapamil HCl IV Soln 2.5 MG/ML: INTRAVENOUS | Qty: 2 | Status: AC

## 2024-03-30 MED FILL — Nitroglycerin IV Soln 100 MCG/ML in D5W: INTRA_ARTERIAL | Qty: 10 | Status: AC

## 2024-03-30 NOTE — Telephone Encounter (Signed)
 Called patient to see if he is interested in the Cardiac Rehab Program. Patient expressed interest. Explained scheduling process and went over insurance, patient verbalized understanding. Will contact patient for scheduling once f/u has been completed.

## 2024-04-07 NOTE — Progress Notes (Signed)
 " Cardiology Office Note:    Date:  04/13/2024   ID:  Lance Boyer, DOB 06-09-1965, MRN [id number]  PCP:  Bobbette Coye LABOR, MD   [facility] Providers Cardiologist:  None     Referring MD: Bobbette Coye LABOR, MD   Chief Complaint  Patient presents with   Coronary Artery Disease    History of Present Illness:    Lance Boyer is a 58 y.o. male is seen for post hospital follow up for CAD/STEMI. He has known CAD.  His is s/p DES of LAD and PDA in 2017 with anterior STEMI. In 2021 he had recurrent STEMI with stenting of the ostial to proximal LAD with overlapping stent. This procedure was done in [place], [place]. Had done well afterwards. He had a  normal stress test  in December at [facility]. Was cleared for right shoulder surgery which was performed on 12/18. Antiplatelet therapy held for surgery. The day after surgery he developed acute severe chest pain which felt similar to prior MI. EMS called. Ecg showed ST elevation in 1, AVL and V2 c/w STEMI. Reciprocal ST depression inferiorly. Given sl Ntg x 3 and ASA. Fortunately on arrival pain much improved. He underwent emergent cardiac cath showing thrombosis of the old LAD stents. There was reperfusion with 80% stenosis. He was treated with POBA. IVUS showed significant underexpansion of the more proximal stent and this was aggressively dilated with IVUS showing excellent expansion. He was treated with ASA and Brilinta . Echo showed EF 50-55%.   On follow up today he is doing very well. No chest pain or SOB. Progressing with PT for his shoulder. No palpitations. Tolerating Brilinta  well.   Past Medical History:  Diagnosis Date   Coronary artery disease    a. PCI 11/02/2015 90% mid LAD s/p DES, 75% Prox RCA s/p DES   GERD (gastroesophageal reflux disease)    High cholesterol 10/08/2014   Myocardial infarction (HCC) 11/01/2015    Past Surgical History:  Procedure Laterality Date   CARDIAC CATHETERIZATION N/A 11/02/2015   Procedure: Left  Heart Cath and Coronary Angiography;  Surgeon: Candyce GORMAN Reek, MD;  Location: [facility] INVASIVE CV LAB;  Service: Cardiovascular;  Laterality: N/A;   CARDIAC CATHETERIZATION N/A 11/02/2015   Procedure: Coronary Stent Intervention;  Surgeon: Candyce GORMAN Reek, MD;  Location: [facility] INVASIVE CV LAB;  Service: Cardiovascular;  Laterality: N/A;   CORONARY ULTRASOUND/IVUS N/A 03/27/2024   Procedure: Coronary Ultrasound/IVUS;  Surgeon: Taron Conrey M, MD;  Location[facility] INVAS[facility] CV LAB;  Service: Cardiovascular;  Laterality: N/A;   CORONARY/GRAFT ACUTE MI REVASCULARIZATION N/A 03/27/2024   Procedure: Coronary/Graft Acute MI Revascularization;  Surgeon: Merryn Thaker M, MD;  Location: [facility] INVASIVE CV LAB;  Service: Cardiovascular;  Laterality: N/A;   LEFT HEART CATH AND CORONARY ANGIOGRAPHY N/A 03/27/2024   Procedure: LEFT HEART CATH AND CORONARY ANGIOGRAPHY;  Surgeon: Tosca Pletz M, MD;  Location: [facility] INVASIVE CV LAB;  Service: Cardiovascular;  Laterality: N/A;    Current Medications: Active Medications[1]   Allergies:   Patient has no known allergies.   Social History   Socioeconomic History   Marital status: Married    Spouse name: Not on file   Number of children: Not on file   Years of education: Not on file   Highest education level: Not on file  Occupational History   Not on file  Tobacco Use   Smoking status: Never   Smokeless tobacco: Never  Substance and Sexual Activity   Alcohol use: Yes  Comment: 11/02/2015 might have a few drinks a few times/year   Drug use: No   Sexual activity: Yes  Other Topics Concern   Not on file  Social History Narrative   Not on file   Social Drivers of Health   Tobacco Use: Low Risk (04/13/2024)   Patient History    Smoking Tobacco Use: Never    Smokeless Tobacco Use: Never    Passive Exposure: Not on file  Financial Resource Strain: Low Risk (01/08/2024)   Received from [facility]   Overall Financial Resource Strain (CARDIA)    How hard  is it for you to pay for the very basics like food, housing, medical care, and heating?: Not hard at all  Food Insecurity: No Food Insecurity (03/27/2024)   Epic    Worried About Radiation Protection Practitioner of Food in the Last Year: Never true    Ran Out of Food in the Last Year: Never true  Transportation Needs: No Transportation Needs (03/27/2024)   Epic    Lack of Transportation (Medical): No    Lack of Transportation (Non-Medical): No  Physical Activity: Insufficiently Active (01/08/2024)   Received from [facility]   Exercise Vital Sign    On average, how many days per week do you engage in moderate to strenuous exercise (like a brisk walk)?: 2 days    On average, how many minutes do you engage in exercise at this level?: 20 min  Stress: No Stress Concern Present (03/26/2024)   Received from [facility] - Occupational Stress Questionnaire    Do you feel stress - tense, restless, nervous, or anxious, or unable to sleep at night because your mind is troubled all the time - these days?: Not at all  Social Connections: Socially Integrated (01/08/2024)   Received from [facility]   Social Network    How would you rate your social network (family, work, friends)?: Good participation with social networks  Depression (PHQ2-9): Not on file  Alcohol Screen: Not on file  Housing: Low Risk (03/27/2024)   Epic    Unable to Pay for Housing in the Last Year: No    Number of Times Moved in the Last Year: 0    Homeless in the Last Year: No  Utilities: Not At Risk (03/27/2024)   Epic    Threatened with loss of utilities: No  Health Literacy: Not on file     Family History: The patient's family history is not on file.  ROS:   Please see the history of present illness.     All other systems reviewed and are negative.  EKGs/Labs/Other Studies Reviewed:    The following studies were reviewed today: Cardiac cath 03/27/24:  Coronary/Graft Acute MI  Revascularization  LEFT HEART CATH AND CORONARY ANGIOGRAPHY  Coronary Ultrasound/IVUS   Conclusion      Prox LAD to Mid LAD lesion is 80% stenosed.   Mid RCA lesion is 40% stenosed.   Previously placed RPDA stent of unknown type is  widely patent.   Balloon angioplasty was performed using a BALLOON [place] EUPHORA RX [id number].   Post intervention, there is a 0% residual stenosis.   LV end diastolic pressure is normal.   Recommend uninterrupted dual antiplatelet therapy with Aspirin  81mg  daily and Ticagrelor  90mg  twice daily for a minimum of 12 months (ACS-Class I recommendation).   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 This appears to be related to stent underexpansion and  interruption of antiplatelet therapy Normal LVEDP 3. Successful PCI of the proximal LAD with [place] balloon to fully expand the prior stents with IVUS guidance     Plan: DAPT for one year. Assess LV function with Echo. May be a candidate for early DC.    Echo 03/28/24: IMPRESSIONS     1. Left ventricular ejection fraction, by estimation, is 50 to 55%. Left  ventricular ejection fraction by 2D MOD biplane is 54.3 %. The left  ventricle has low normal function. The left ventricle demonstrates  regional wall motion abnormalities (see  scoring diagram/findings for description). Left ventricular diastolic  parameters were normal.   2. Right ventricular systolic function is normal. The right ventricular  size is normal. Tricuspid regurgitation signal is inadequate for assessing  PA pressure.   3. The mitral valve is normal in structure. Trivial mitral valve  regurgitation.   4. The aortic valve is tricuspid. Aortic valve regurgitation is trivial.   Comparison(s): No prior Echocardiogram.        Recent Labs: 03/27/2024: ALT 45 03/28/2024: BUN 12; Creatinine, Ser 0.97; Hemoglobin 12.9; Platelets 296; Potassium 3.8; Sodium 138  Recent Lipid Panel    Component Value Date/Time   CHOL  162 03/27/2024 1213   TRIG 104 03/27/2024 1213   HDL 63 03/27/2024 1213   CHOLHDL 2.6 03/27/2024 1213   VLDL 21 03/27/2024 1213   LDLCALC 78 03/27/2024 1213     Risk Assessment/Calculations:                Physical Exam:    VS:  BP 110/60 (BP Location: Left Arm, Patient Position: Sitting)   Pulse 73   Ht 5' 9 (1.753 m)   Wt 196 lb 8 oz (89.1 kg)   SpO2 95%   BMI 29.02 kg/m     Wt Readings from Last 3 Encounters:  04/13/24 196 lb 8 oz (89.1 kg)  03/27/24 196 lb 6.9 oz (89.1 kg)  08/07/17 178 lb (80.7 kg)     GEN:  Well nourished, well developed in no acute distress HEENT: Normal NECK: No JVD; No carotid bruits LYMPHATICS: No lymphadenopathy CARDIAC: RRR, no murmurs, rubs, gallops RESPIRATORY:  Clear to auscultation without rales, wheezing or rhonchi  ABDOMEN: Soft, non-tender, non-distended MUSCULOSKELETAL:  No edema; No deformity  SKIN: Warm and dry NEUROLOGIC:  Alert and oriented x 3 PSYCHIATRIC:  Normal affect   ASSESSMENT:    1. CAD S/P percutaneous coronary angioplasty   2. Dyslipidemia    PLAN:    In order of problems listed above:  CAD s/p recent anterior STEMI due to late stent thrombosis. Mechanism was stent underexpansion and interruption of antiplatelet therapy. Stent well expanded with IVUS guidance. Will continue ASA and Brilinta  for one year then likely ASA and Plavix  long term  HLD on max atorvastatin  and Zetia. Will update labs in 2 months. Goal LDL < 55. If not at goal consider Repatha.     Cardiac Rehabilitation Eligibility Assessment            Medication Adjustments/Labs and Tests Ordered: Current medicines are reviewed at length with the patient today.  Concerns regarding medicines are outlined above.  Orders Placed This Encounter  Procedures   Basic metabolic panel with GFR   Lipid panel   Hepatic function panel   Meds ordered this encounter  Medications   nitroGLYCERIN  (NITROSTAT ) 0.4 MG SL tablet    Sig: Place 1 tablet  (0.4 mg total) under the tongue every 5 (five) minutes x  3 doses as needed for chest pain.    Dispense:  25 tablet    Refill:  1    There are no Patient Instructions on file for this visit.   Signed, Malacai Grantz, MD  04/13/2024 2:15 PM    [facility]     [1]  Current Meds  Medication Sig   aspirin  EC 81 MG tablet Take 1 tablet (81 mg total) by mouth daily.   atorvastatin  (LIPITOR ) 80 MG tablet Take 1 tablet (80 mg total) by mouth daily at 6 PM.   ezetimibe (ZETIA) 10 MG tablet Take 10 mg by mouth every evening.   metoprolol  succinate (TOPROL -XL) 50 MG 24 hr tablet Take 50 mg by mouth daily. Take with or immediately following a meal.   oxyCODONE  (OXY IR/ROXICODONE ) 5 MG immediate release tablet Take 5 mg by mouth every 6 (six) hours as needed for moderate pain (pain score 4-6).   ticagrelor  (BRILINTA ) 90 MG TABS tablet Take 1 tablet (90 mg total) by mouth 2 (two) times daily.   "

## 2024-04-13 ENCOUNTER — Encounter: Payer: Self-pay | Admitting: Cardiology

## 2024-04-13 ENCOUNTER — Ambulatory Visit: Admitting: Cardiology

## 2024-04-13 VITALS — BP 110/60 | HR 73 | Ht 69.0 in | Wt 196.5 lb

## 2024-04-13 DIAGNOSIS — I2510 Atherosclerotic heart disease of native coronary artery without angina pectoris: Secondary | ICD-10-CM | POA: Diagnosis not present

## 2024-04-13 DIAGNOSIS — Z9861 Coronary angioplasty status: Secondary | ICD-10-CM

## 2024-04-13 DIAGNOSIS — E785 Hyperlipidemia, unspecified: Secondary | ICD-10-CM

## 2024-04-13 MED ORDER — NITROGLYCERIN 0.4 MG SL SUBL: 0.4000 mg | SUBLINGUAL_TABLET | SUBLINGUAL | 1 refills | Status: AC | PRN

## 2024-04-13 NOTE — Patient Instructions (Addendum)
 Medication Instructions:  Continue all medications *If you need a refill on your cardiac medications before your next appointment, please call your pharmacy*  Lab Work: Fasting lipid and hepatic panels,bmet in 2 months   Testing/Procedures: None ordered  Follow-Up: At [facility], you and your health needs are our priority.  As part of our continuing mission to provide you with exceptional heart care, our providers are all part of one team.  This team includes your primary Cardiologist (physician) and Advanced Practice Providers or APPs (Physician Assistants and Nurse Practitioners) who all work together to provide you with the care you need, when you need it.  Your next appointment:  3 months  Call in Feb to schedule April appointment     Provider:  Dr.Jordan   We recommend signing up for the patient portal called MyChart.  Sign up information is provided on this After Visit Summary.  MyChart is used to connect with patients for Virtual Visits (Telemedicine).  Patients are able to view lab/test results, encounter notes, upcoming appointments, etc.  Non-urgent messages can be sent to your provider as well.   To learn more about what you can do with MyChart, go to [email or web address].

## 2024-04-14 ENCOUNTER — Encounter: Payer: Self-pay | Admitting: Cardiology

## 2024-04-14 NOTE — Progress Notes (Addendum)
 History of Present Illness The patient is a 59 year old male who presents after experiencing restenosis of an old stent in his LAD. He had an anterior STEMI in 2000. He recently underwent right shoulder surgery and developed acute severe chest pain the day after surgery. An EKG showed findings consistent with a STEMI.  He was immediately transferred to the cath lab at [facility], where it was discovered that one of the stents placed in 2021 during a vacation in [place], [place] , was not fully extended. This, combined with the discontinuation of Plavix  and blood thinners, led to significant turbulence and subsequent heart attack. The stent was then properly aligned and smoothed out through angioplasty. His medication was switched from Plavix  to Brilinta , to be taken twice daily. His cardiologist plans to reassess his condition in a year and may consider discontinuing Brilinta  if deemed unnecessary, replacing it with aspirin  instead. His LDL levels were found to be 77 during a recent hospital visit, which his cardiologist considered high given his risk profile. A reevaluation of his cholesterol levels is scheduled in 2 months, and there is a possibility of replacing ezetimibe with Repatha. He reports no cognitive impairment or leg pain, which are potential side effects of his current medication. His LDL levels have fluctuated between 58 and 100 in the past. He has experienced 3 heart attacks, but reports feeling significantly better after the most recent one. He recalls experiencing palpitations and mild pain for days, weeks, and months following his previous 2 heart attacks, which caused him considerable anxiety. However, he currently feels well and is optimistic about his recovery. He is scheduled to start cardiac rehabilitation next month.  He is currently recovering from right shoulder surgery, which has been causing him considerable pain. He was informed that the procedure might be more painful  due to the reattachment of the tendon under pressure and the grinding down of the humerus. He occasionally applies ice and uses a heating pad, which provides significant relief. He reports that the pain is gradually decreasing each day. He is not taking any pain medication, but received sufficient pain relief during his hospital stay for his heart condition. He also had a nerve block, which resulted in numbness in his arm for a few days. He describes the pain from his shoulder surgery as more intense than that of a heart attack.  He has been referred to Dr. Rosella, a back specialist, for further evaluation. X-rays revealed arthritis and degenerative disk disease, among other conditions contributing to his pain. He received injections on both sides, which provided only 1 day of pain relief and caused severe hiccups for 48 hours. He had a similar reaction to a cortisone injection in his shoulder. He plans to undergo ablation in the coming months.  He has gained weight and is currently walking on his treadmill for at least 10 minutes to improve his cardiovascular fitness. He is interested in losing weight and has friends who have successfully used weight loss medications, but he prefers to achieve weight loss through exercise.  His A1c levels have consistently been borderline prediabetic, and he believes that weight loss could potentially improve this.  PAST SURGICAL HISTORY: - Right shoulder surgery - Stent placement in LAD in 2021 - Previous stent placements (dates unspecified)  Vitals:   04/14/24 0858  BP: 123/89  Pulse: 78  Resp: 16  Temp: 98.4 F (36.9 C)  SpO2: 97%   Gen: Alert, oriented, non toxic, and well hydrated.  No signs of acute  distress. HEENT: Normocephalic.  Atraumatic.  PERRLA.   Neck: Supple. No lymphadenopathy Respiratory:  No use of accessory muscles. Cardiovascular: Regular rate and rhythm.   Abdominal:  Soft, non tender, non distended.    Neuro: Cranial nerves intact  grossly.  No loss of strength, sensation MS:  Decreased ROM right shoulder.  No cyanosis, clubbing, or edema. Skin:  No rashes noted Psych: Oriented, alert.  Assessment & Plan  1. ST elevation myocardial infarction involving left anterior descending (LAD) coronary artery (*)      2. S/P right rotator cuff repair      3. Degeneration of intervertebral disc of lumbar region with discogenic back pain       MDM:   1. Recent ST-elevation myocardial infarction (STEMI) with stent placement: - He reports no current pain and is actively participating in cardiac rehabilitation. - The treatment plan includes the continuation of Brilinta  and Plavix  for a duration of one year, after which a transition to aspirin  may be considered. - The discontinuation of ezetimibe is under consideration, with a potential switch to Repatha. The current regimen of atorvastatin  80 mg daily will be maintained.  2. Right shoulder arthroscopic surgery: - He is in the recovery phase from this procedure, which is expected to be protracted. He reports significant pain and a reduced range of motion. - He uses ice and a heating pad for pain relief. He does not take any pain medications currently.  3. Arthritis and degenerative disk disease: - He has been diagnosed with arthritis and degenerative disk disease, which are contributing to his back pain. - Cortisone shots provided only temporary relief and caused hiccups. - Future treatment options include either a mechanical device for electro-magnetic impulse therapy or an ablation procedure.  4. Weight management: - He has gained weight and is currently walking on his treadmill for at least 10 minutes to improve his cardiovascular fitness. - He is interested in losing weight and has friends who have successfully used weight loss medications, but he prefers to achieve weight loss through exercise.  Addendum: Mr. Swaim spoke to his cardiologist and the cardiologist agreed  he was a candidate for Zepbound for weight management given Mr. Matranga' history of multiple MI's. GLP-1s have been shown to decrease mortality from heart disease by 30%  5. Prediabetes: - His A1c levels have consistently been borderline prediabetic, and he believes that weight loss could potentially improve this.  Follow-up: He will start cardiac rehab next month.  Patient  verbalized to me that they understood what their problem is, what they need to do about it, and why it is important that they do it.  The patient/family voices understanding of all medications. No barriers to adherence were noted. Patient is taking all medications as prescribed and is tolerating well.  Plan for follow-up as discussed or as needed if any worsening symptoms or change in condition.
